# Patient Record
Sex: Male | Born: 1975 | Race: Black or African American | Hispanic: No | Marital: Single | Smoking: Former smoker
Health system: Southern US, Community
[De-identification: ages and names within clinical notes are randomized; demographics above are authoritative.]

## PROBLEM LIST (undated history)

## (undated) DIAGNOSIS — K219 Gastro-esophageal reflux disease without esophagitis: Secondary | ICD-10-CM

## (undated) DIAGNOSIS — Z87891 Personal history of nicotine dependence: Secondary | ICD-10-CM

## (undated) DIAGNOSIS — E119 Type 2 diabetes mellitus without complications: Secondary | ICD-10-CM

---

## 2005-02-11 ENCOUNTER — Emergency Department (HOSPITAL_COMMUNITY): Admission: EM | Admit: 2005-02-11 | Discharge: 2005-02-11 | Payer: Self-pay | Admitting: Emergency Medicine

## 2005-07-05 ENCOUNTER — Emergency Department: Payer: Self-pay | Admitting: Emergency Medicine

## 2010-09-10 ENCOUNTER — Emergency Department: Payer: Self-pay | Admitting: Emergency Medicine

## 2010-10-16 ENCOUNTER — Emergency Department: Payer: Self-pay | Admitting: Internal Medicine

## 2010-11-21 ENCOUNTER — Emergency Department: Payer: Self-pay | Admitting: Emergency Medicine

## 2011-10-02 ENCOUNTER — Emergency Department: Payer: Self-pay | Admitting: *Deleted

## 2011-10-02 LAB — BASIC METABOLIC PANEL
BUN: 6 mg/dL — ABNORMAL LOW (ref 7–18)
Co2: 31 mmol/L (ref 21–32)
EGFR (African American): >60
Glucose: 95 mg/dL (ref 65–99)
Osmolality: 279 (ref 275–301)
Potassium: 3.9 mmol/L (ref 3.5–5.1)

## 2011-10-02 LAB — TROPONIN I
Troponin-I: <0.02 ng/mL
Troponin-I: <0.02 ng/mL

## 2011-10-02 LAB — CBC
MCH: 26.3 pg (ref 26.0–34.0)
MCHC: 32.1 g/dL (ref 32.0–36.0)
MCV: 82 fL (ref 80–100)

## 2011-10-02 LAB — HEPATIC FUNCTION PANEL A (ARMC)
Bilirubin, Direct: <0.1 mg/dL (ref 0.00–0.20)
SGPT (ALT): 40 U/L
Total Protein: 8 g/dL (ref 6.4–8.2)

## 2011-10-02 LAB — LIPASE, BLOOD: Lipase: 109 U/L (ref 73–393)

## 2012-05-30 IMAGING — CR DG CHEST 2V
1 series · 2 of 2 positions shown · non-contrast
Comparison: none

REASON FOR EXAM: chest pain.
COMMENTS:

PROCEDURE:     DXR - DXR CHEST PA (OR AP) AND LATERAL  - October 02, 2011  [DATE]
RESULT:     The cardiac silhouette is at the upper limits of normal to
mildly enlarged. The lungs are clear. The bony and mediastinal structures
are unremarkable.

[Series 1: pa · 0.17mm/px · 2 of 2 slices shown]
[im 1/2]
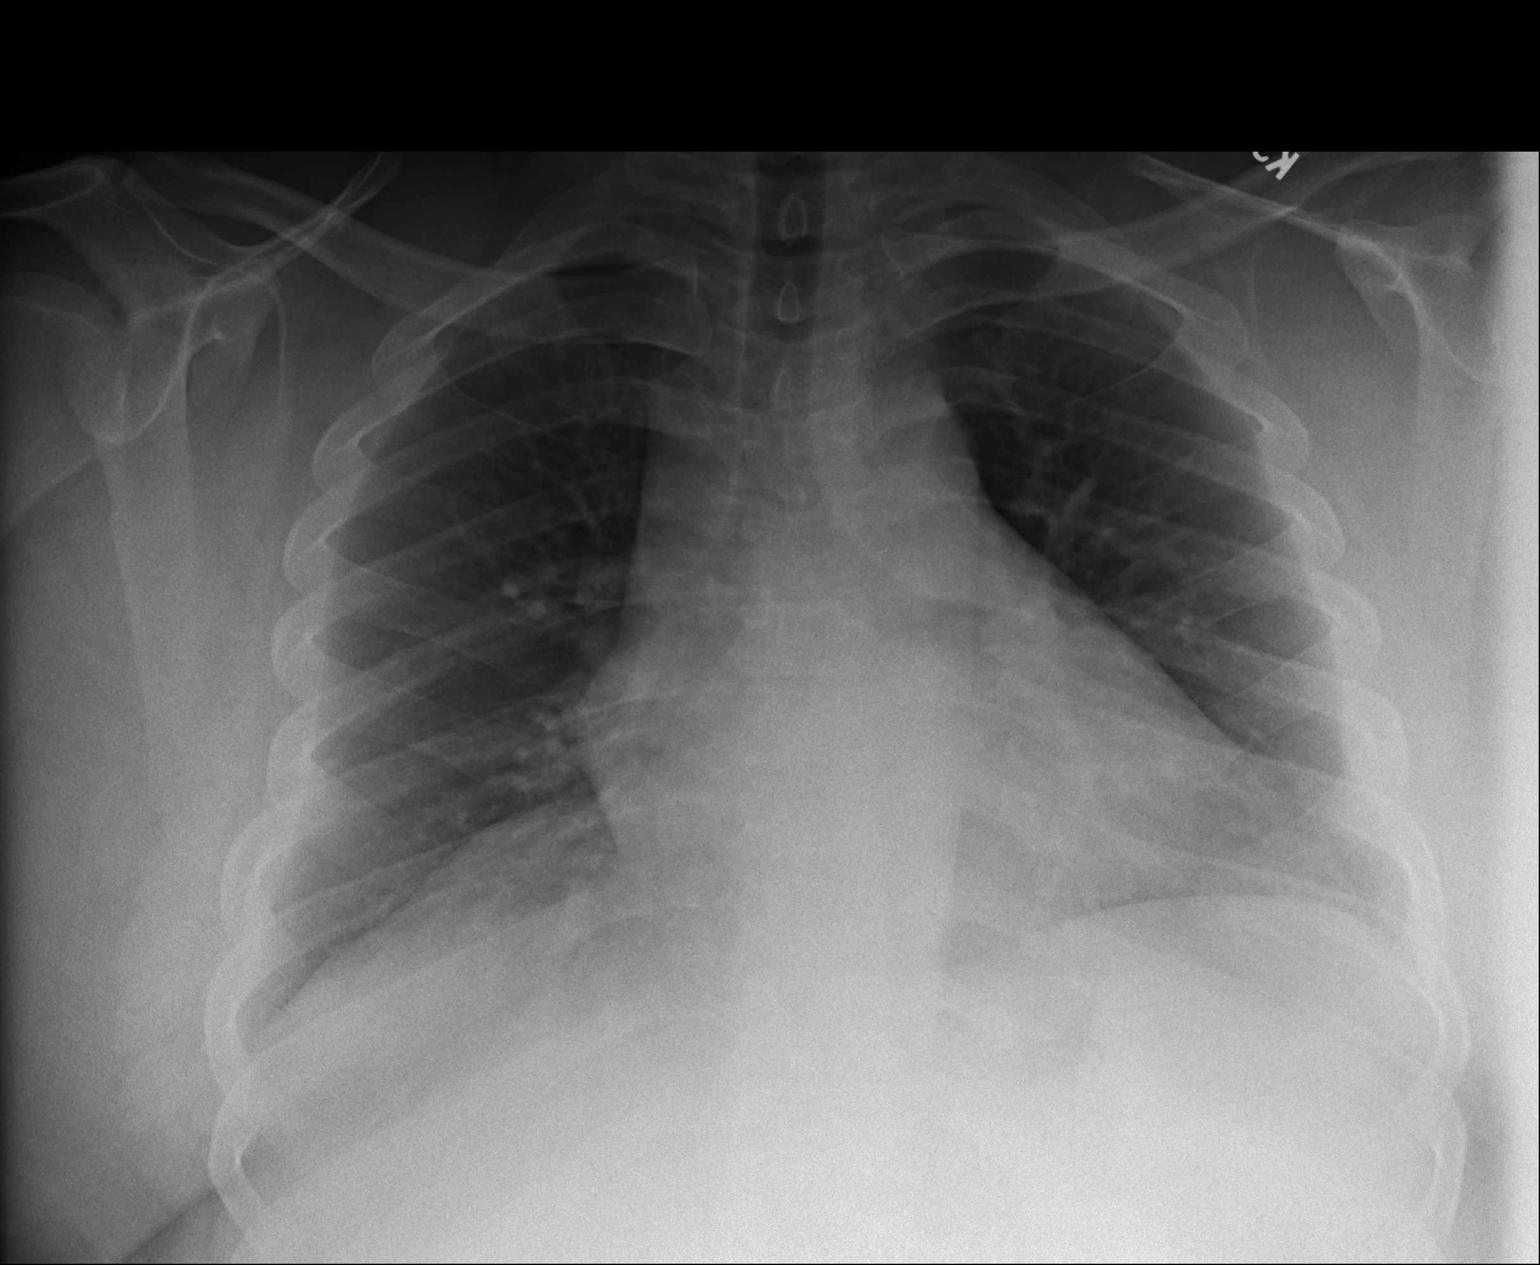
[im 2/2]
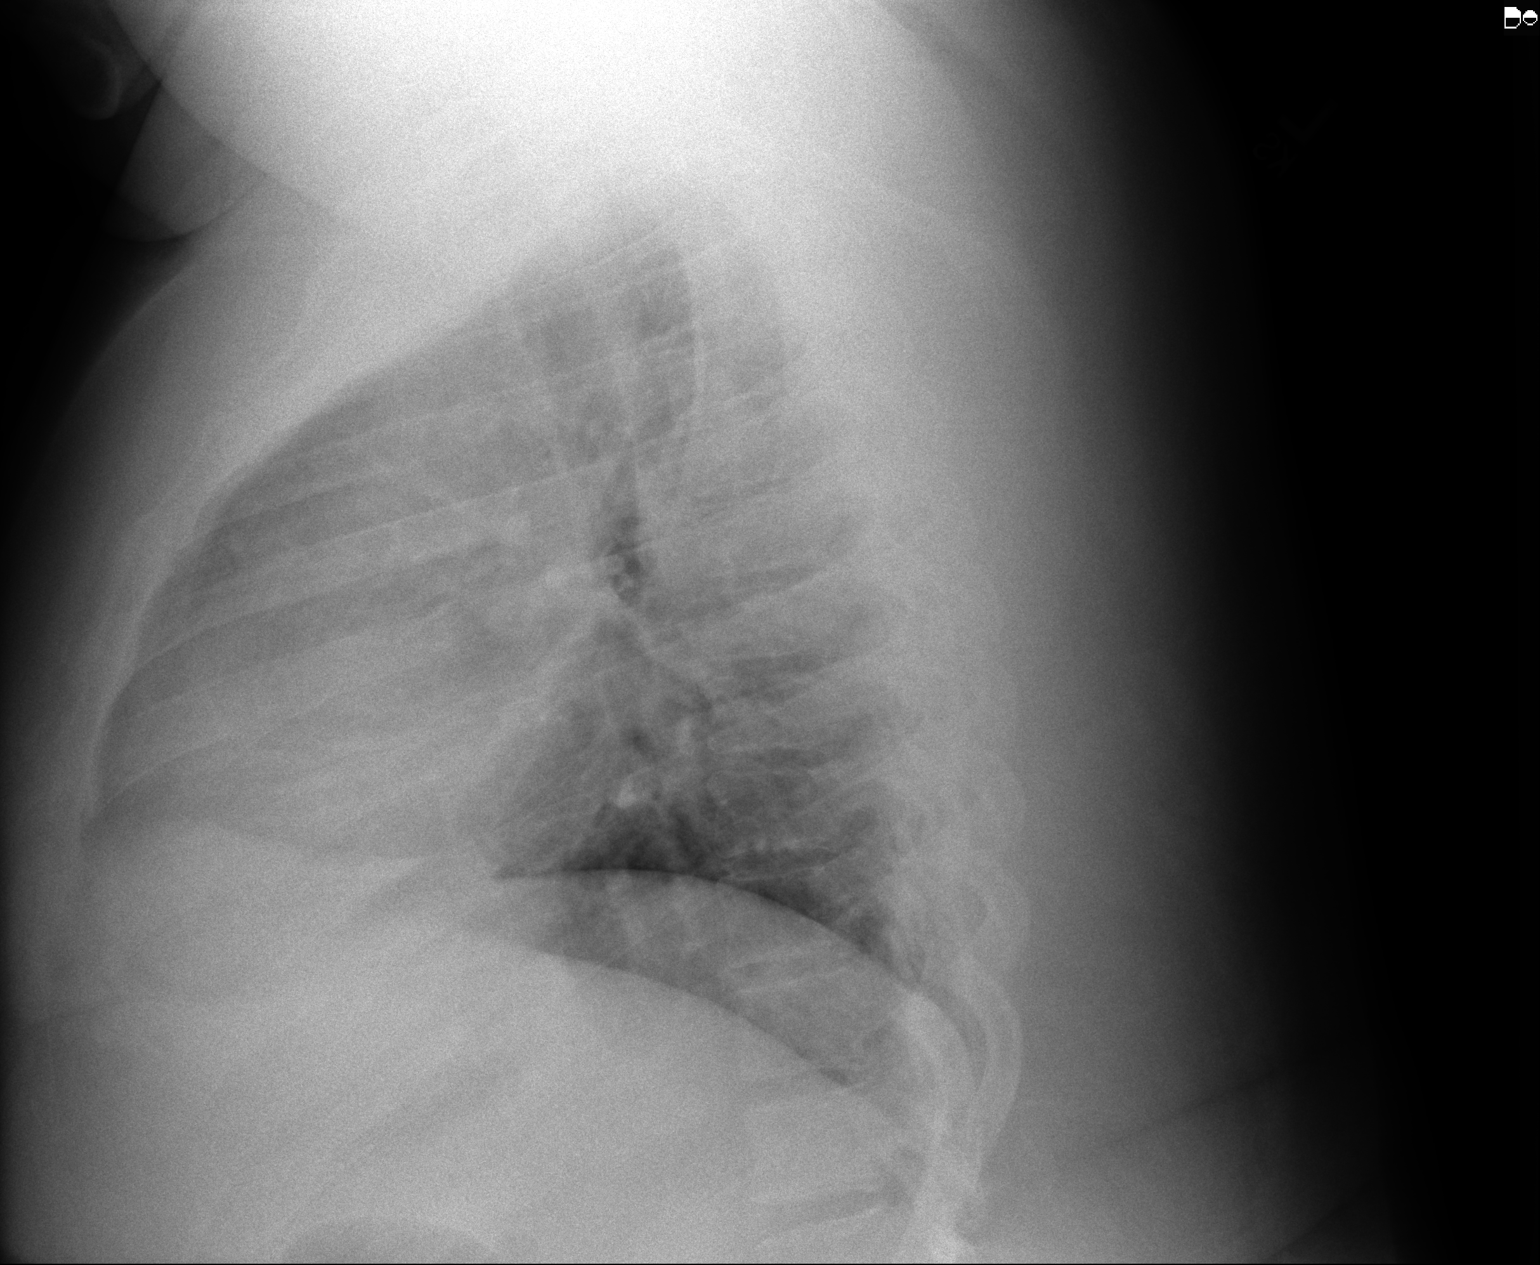

[2 of 2 positions shown; findings below may reference images not displayed]

IMPRESSION: Mild cardiomegaly.

[REDACTED]

## 2015-10-16 ENCOUNTER — Emergency Department
Admission: EM | Admit: 2015-10-16 | Discharge: 2015-10-16 | Disposition: A | Discharge status: Home / Self Care | Payer: Self-pay | Attending: Emergency Medicine | Admitting: Emergency Medicine

## 2015-10-16 ENCOUNTER — Encounter: Payer: Self-pay | Admitting: Emergency Medicine

## 2015-10-16 DIAGNOSIS — K029 Dental caries, unspecified: Secondary | ICD-10-CM | POA: Insufficient documentation

## 2015-10-16 DIAGNOSIS — K047 Periapical abscess without sinus: Secondary | ICD-10-CM | POA: Insufficient documentation

## 2015-10-16 DIAGNOSIS — F17200 Nicotine dependence, unspecified, uncomplicated: Secondary | ICD-10-CM | POA: Insufficient documentation

## 2015-10-16 DIAGNOSIS — K0500 Acute gingivitis, plaque induced: Secondary | ICD-10-CM | POA: Insufficient documentation

## 2015-10-16 MED ORDER — TRAMADOL HCL 50 MG PO TABS: 50.0000 mg | ORAL_TABLET | Freq: Three times a day (TID) | ORAL | Status: DC | PRN

## 2015-10-16 MED ORDER — PENICILLIN V POTASSIUM 500 MG PO TABS
500.0000 mg | ORAL_TABLET | Freq: Once | ORAL | Status: AC
  Administered 2015-10-16: 500 mg via ORAL
  Filled 2015-10-16: qty 1

## 2015-10-16 MED ORDER — TRAMADOL HCL 50 MG PO TABS
100.0000 mg | ORAL_TABLET | Freq: Once | ORAL | Status: AC
  Administered 2015-10-16: 100 mg via ORAL
  Filled 2015-10-16: qty 2

## 2015-10-16 MED ORDER — PENICILLIN V POTASSIUM 500 MG PO TABS: 500.0000 mg | ORAL_TABLET | Freq: Four times a day (QID) | ORAL | Status: DC

## 2015-10-16 NOTE — Discharge Instructions (Signed)
Dental Abscess °A dental abscess is a collection of pus in or around a tooth. °CAUSES °This condition is caused by a bacterial infection around the root of the tooth that involves the inner part of the tooth (pulp). It may result from: °· Severe tooth decay. °· Trauma to the tooth that allows bacteria to enter into the pulp, such as a broken or chipped tooth. °· Severe gum disease around a tooth. °SYMPTOMS °Symptoms of this condition include: °· Severe pain in and around the infected tooth. °· Swelling and redness around the infected tooth, in the mouth, or in the face. °· Tenderness. °· Pus drainage. °· Bad breath. °· Bitter taste in the mouth. °· Difficulty swallowing. °· Difficulty opening the mouth. °· Nausea. °· Vomiting. °· Chills. °· Swollen neck glands. °· Fever. °DIAGNOSIS °This condition is diagnosed with examination of the infected tooth. During the exam, your dentist may tap on the infected tooth. Your dentist will also ask about your medical and dental history and may order X-rays. °TREATMENT °This condition is treated by eliminating the infection. This may be done with: °· Antibiotic medicine. °· A root canal. This may be performed to save the tooth. °· Pulling (extracting) the tooth. This may also involve draining the abscess. This is done if the tooth cannot be saved. °HOME CARE INSTRUCTIONS °· Take medicines only as directed by your dentist. °· If you were prescribed antibiotic medicine, finish all of it even if you start to feel better. °· Rinse your mouth (gargle) often with salt water to relieve pain or swelling. °· Do not drive or operate heavy machinery while taking pain medicine. °· Do not apply heat to the outside of your mouth. °· Keep all follow-up visits as directed by your dentist. This is important. °SEEK MEDICAL CARE IF: °· Your pain is worse and is not helped by medicine. °SEEK IMMEDIATE MEDICAL CARE IF: °· You have a fever or chills. °· Your symptoms suddenly get worse. °· You have a  very bad headache. °· You have problems breathing or swallowing. °· You have trouble opening your mouth. °· You have swelling in your neck or around your eye. °  °This information is not intended to replace advice given to you by your health care provider. Make sure you discuss any questions you have with your health care provider. °  °Document Released: 05/09/2005 Document Revised: 09/23/2014 Document Reviewed: 05/06/2014 °Elsevier Interactive Patient Education ©2016 Elsevier Inc. ° °Dental Caries °Dental caries (also called tooth decay) is the most common oral disease. It can occur at any age but is more common in children and young adults.  °HOW DENTAL CARIES DEVELOPS  °The process of decay begins when bacteria and foods (particularly sugars and starches) combine in your mouth to produce plaque. Plaque is a substance that sticks to the hard, outer surface of a tooth (enamel). The bacteria in plaque produce acids that attack enamel. These acids may also attack the root surface of a tooth (cementum) if it is exposed. Repeated attacks dissolve these surfaces and create holes in the tooth (cavities). If left untreated, the acids destroy the other layers of the tooth.  °RISK FACTORS °· Frequent sipping of sugary beverages.   °· Frequent snacking on sugary and starchy foods, especially those that easily get stuck in the teeth.   °· Poor oral hygiene.   °· Dry mouth.   °· Substance abuse such as methamphetamine abuse.   °· Broken or poor-fitting dental restorations.   °· Eating disorders.   °· Gastroesophageal reflux disease (  GERD).   Certain radiation treatments to the head and neck. SYMPTOMS In the early stages of dental caries, symptoms are seldom present. Sometimes white, chalky areas may be seen on the enamel or other tooth layers. In later stages, symptoms may include:  Pits and holes on the enamel.  Toothache after sweet, hot, or cold foods or drinks are consumed.  Pain around the tooth.  Swelling  around the tooth. DIAGNOSIS  Most of the time, dental caries is detected during a regular dental checkup. A diagnosis is made after a thorough medical and dental history is taken and the surfaces of your teeth are checked for signs of dental caries. Sometimes special instruments, such as lasers, are used to check for dental caries. Dental X-ray exams may be taken so that areas not visible to the eye (such as between the contact areas of the teeth) can be checked for cavities.  TREATMENT  If dental caries is in its early stages, it may be reversed with a fluoride treatment or an application of a remineralizing agent at the dental office. Thorough brushing and flossing at home is needed to aid these treatments. If it is in its later stages, treatment depends on the location and extent of tooth destruction:   If a small area of the tooth has been destroyed, the destroyed area will be removed and cavities will be filled with a material such as gold, silver amalgam, or composite resin.   If a large area of the tooth has been destroyed, the destroyed area will be removed and a cap (crown) will be fitted over the remaining tooth structure.   If the center part of the tooth (pulp) is affected, a procedure called a root canal will be needed before a filling or crown can be placed.   If most of the tooth has been destroyed, the tooth may need to be pulled (extracted). HOME CARE INSTRUCTIONS You can prevent, stop, or reverse dental caries at home by practicing good oral hygiene. Good oral hygiene includes:  Thoroughly cleaning your teeth at least twice a day with a toothbrush and dental floss.   Using a fluoride toothpaste. A fluoride mouth rinse may also be used if recommended by your dentist or health care provider.   Restricting the amount of sugary and starchy foods and sugary liquids you consume.   Avoiding frequent snacking on these foods and sipping of these liquids.   Keeping regular  visits with a dentist for checkups and cleanings. PREVENTION   Practice good oral hygiene.  Consider a dental sealant. A dental sealant is a coating material that is applied by your dentist to the pits and grooves of teeth. The sealant prevents food from being trapped in them. It may protect the teeth for several years.  Ask about fluoride supplements if you live in a community without fluorinated water or with water that has a low fluoride content. Use fluoride supplements as directed by your dentist or health care provider.  Allow fluoride varnish applications to teeth if directed by your dentist or health care provider.   This information is not intended to replace advice given to you by your health care provider. Make sure you discuss any questions you have with your health care provider.   Document Released: 01/29/2002 Document Revised: 05/30/2014 Document Reviewed: 05/11/2012 Elsevier Interactive Patient Education 2016 [place] and Dentist Visits Dental care supports good overall health. Regular dental visits can also help you avoid dental pain, bleeding, infection,  and other more serious health problems in the future. It is important to keep the mouth healthy because diseases in the teeth, gums, and other oral tissues can spread to other areas of the body. Some problems, such as diabetes, heart disease, and pre-term labor have been associated with poor oral health.  See your dentist every 6 months. If you experience emergency problems such as a toothache or broken tooth, go to the dentist right away. If you see your dentist regularly, you may catch problems early. It is easier to be treated for problems in the early stages.  WHAT TO EXPECT AT A DENTIST VISIT  Your dentist will look for many common oral health problems and recommend proper treatment. At your regular dental visit, you can expect:  Gentle cleaning of the teeth and gums. This includes scraping and  polishing. This helps to remove the sticky substance around the teeth and gums (plaque). Plaque forms in the mouth shortly after eating. Over time, plaque hardens on the teeth as tartar. If tartar is not removed regularly, it can cause problems. Cleaning also helps remove stains.  Periodic X-rays. These pictures of the teeth and supporting bone will help your dentist assess the health of your teeth.  Periodic fluoride treatments. Fluoride is a natural mineral shown to help strengthen teeth. Fluoride treatmentinvolves applying a fluoride gel or varnish to the teeth. It is most commonly done in children.  Examination of the mouth, tongue, jaws, teeth, and gums to look for any oral health problems, such as:  Cavities (dental caries). This is decay on the tooth caused by plaque, sugar, and acid in the mouth. It is best to catch a cavity when it is small.  Inflammation of the gums caused by plaque buildup (gingivitis).  Problems with the mouth or malformed or misaligned teeth.  Oral cancer or other diseases of the soft tissues or jaws. KEEP YOUR TEETH AND GUMS HEALTHY For healthy teeth and gums, follow these general guidelines as well as your dentist's specific advice:  Have your teeth professionally cleaned at the dentist every 6 months.  Brush twice daily with a fluoride toothpaste.  Floss your teeth daily.  Ask your dentist if you need fluoride supplements, treatments, or fluoride toothpaste.  Eat a healthy diet. Reduce foods and drinks with added sugar.  Avoid smoking. TREATMENT FOR ORAL HEALTH PROBLEMS If you have oral health problems, treatment varies depending on the conditions present in your teeth and gums.  Your caregiver will most likely recommend good oral hygiene at each visit.  For cavities, gingivitis, or other oral health disease, your caregiver will perform a procedure to treat the problem. This is typically done at a separate appointment. Sometimes your caregiver  will refer you to another dental specialist for specific tooth problems or for surgery. SEEK IMMEDIATE DENTAL CARE IF:  You have pain, bleeding, or soreness in the gum, tooth, jaw, or mouth area.  A permanent tooth becomes loose or separated from the gum socket.  You experience a blow or injury to the mouth or jaw area.   This information is not intended to replace advice given to you by your health care provider. Make sure you discuss any questions you have with your health care provider.   Document Released: 01/19/2011 Document Revised: 08/01/2011 Document Reviewed: 01/19/2011 Elsevier Interactive Patient Education [employer]2016 Elsevier Inc.   Take the antibiotic as directed. Apply warm compresses to the face to reduce swelling. Rinse with warm, salty water several times a day. Brush  with a soft toothbrush twice daily.   OPTIONS FOR DENTAL FOLLOW UP CARE  [place] Department of Health and Human Services - Local Safety Net Dental Clinics TripDoors.com.htm   [facility] [phone])  [facility] [phone])  [place] [phone])  [facility] [phone])  [facility] [phone]) This clinic caters to the indigent population and is on a lottery system. Location: [employer] [facility], [employer], [address] [place] Clinic Hours: Wednesdays from 6pm - 9pm, patients seen by a lottery system. For dates, call or go to [email or web address] Services: Cleanings, fillings and simple extractions. Payment Options: DENTAL WORK IS FREE OF CHARGE. Bring proof of income or support. Best way to get seen: Arrive at 5:15 pm - this is a lottery, NOT first come/first serve, so arriving earlier will not increase your chances of being seen.     [facility] [phone] Select option 1 for emergencies    Location: [facility], [place], [address], [place] Clinic Hours: No walk-ins accepted - call the day before to schedule an appointment. Check in times are 9:30 am and 1:30 pm. Services: Simple extractions, temporary fillings, pulpectomy/pulp debridement, uncomplicated abscess drainage. Payment Options: PAYMENT IS DUE AT THE TIME OF SERVICE.  Fee is usually $100-200, additional surgical procedures (e.g. abscess drainage) may be extra. Cash, checks, Visa/MasterCard accepted.  Can file Medicaid if patient is covered for dental - patient should call case worker to check. No discount for [facility] patients. Best way to get seen: MUST call the day before and get onto the schedule. Can usually be seen the next 1-2 days. No walk-ins accepted.     [facility] [phone]   Location: [facility], [address], [place] Clinic Hours: M, W, Th, F 8am or 1:30pm, Tues 9a or 1:30 - first come/first served. Services: Simple extractions, temporary fillings, uncomplicated abscess drainage.  You do not need to be an [facility] resident. Payment Options: PAYMENT IS DUE AT THE TIME OF SERVICE. Dental insurance, otherwise sliding scale - bring proof of income or support. Depending on income and treatment needed, cost is usually $50-200. Best way to get seen: Arrive early as it is first come/first served.     [facility] [phone]   Location: [address] Clinic Hours: Mon-Thu 8a-5p Services: Most basic dental services including extractions and fillings. Payment Options: PAYMENT IS DUE AT THE TIME OF SERVICE. Sliding scale, up to 50% off - bring proof if income or support. Medicaid with dental option accepted. Best way to get seen: Call to schedule an appointment, can usually be seen within 2 weeks OR they will try to see walk-ins - show up at 8a or 2p (you may have  to wait).     [facility] [phone] [place] RESIDENTS ONLY   Location: [facility], [address], [place], [place] [address] Clinic Hours: By appointment only. Monday - Thursday 8am-5pm, Friday 8am-12pm Services: Cleanings, fillings, extractions. Payment Options: PAYMENT IS DUE AT THE TIME OF SERVICE. Cash, Visa or MasterCard. Sliding scale - $30 minimum per service. Best way to get seen: Come in to office, complete packet and make an appointment - need proof of income or support monies for each household member and proof of [facility] residence. Usually takes about a month to get in.     [facility]  Dental Clinic [phone]   Location: [address], [facility] Hours: Walk-in Urgent Care Dental Services are offered Monday-Friday mornings only. The numbers of emergencies accepted daily is limited to the number of providers available. Maximum 15 - Mondays, Wednesdays & Thursdays Maximum 10 - Tuesdays & Fridays Services: You do not need to be a [facility] resident to be seen for a dental emergency. Emergencies are defined as pain, swelling, abnormal bleeding, or dental trauma. Walkins will receive x-rays if needed. NOTE: Dental cleaning is not an emergency. Payment Options: PAYMENT IS DUE AT THE TIME OF SERVICE. Minimum co-pay is $40.00 for uninsured patients. Minimum co-pay is $3.00 for Medicaid with dental coverage. Dental Insurance is accepted and must be presented at time of visit. Medicare does not cover dental. Forms of payment: Cash, credit card, checks. Best way to get seen: If not previously registered with the clinic, walk-in dental registration begins at 7:15 am and is on a first come/first serve basis. If previously registered with the clinic, call to make an appointment.     [facility] [phone] [place] RESIDENTS ONLY   Location: [address] [place],  [place] Clinic Hours: Mon-Thu 10a-2p Services: Extractions only! Payment Options: FREE (donations accepted) - bring proof of income or support Best way to get seen: Call and schedule an appointment OR come at 8am on the 1st Monday of every month (except for holidays) when it is first come/first served.     [facility] [phone]   Location: [address] [place], [place] Clinic Hours: Friday mornings Services, Payment Options, Best way to get seen: Call for info

## 2015-10-16 NOTE — ED Notes (Signed)
Tooth pain for a few days   Noticed facial swelling 2 days ago

## 2015-10-16 NOTE — ED Notes (Signed)
States he has had some pain and swelling to left side of face about 1-2 weeks ago  That went down  Now has swelling to right side of face states he does have a loose tooth in same area..main pain is at jaw and into neck

## 2015-10-21 NOTE — ED Provider Notes (Signed)
[facility] Emergency Department Provider Note ____________________________________________  Time seen: 0920  I have reviewed the triage Valenzuela signs and the nursing notes.  HISTORY  Chief Complaint  Facial Swelling  HPI [name] is a 40 y.o. male presents to the ED for lower jaw pain and swelling suddenly over the last 2 days.He admits that the swelling to the face has been intermittent over the last 1-2 weeks. He also admits to poor dentition in general. He describes that the swelling initially had gone down and now she return to the lower jaw. He does also noted loose tooth in the same area. He denies any interim fevers, chills, sweats. He also has not had any nausea, vomiting, or diarrhea. He rates his overall discomfort at a 10/10 in triage.  History reviewed. No pertinent past medical history.  There are no active problems to display for this patient.   History reviewed. No pertinent past surgical history.  Current Outpatient Rx  Name  Route  Sig  Dispense  Refill  . penicillin v potassium (VEETID) 500 MG tablet   Oral   Take 1 tablet (500 mg total) by mouth 4 (four) times daily.   40 tablet   0   . traMADol (ULTRAM) 50 MG tablet   Oral   Take 1 tablet (50 mg total) by mouth 3 (three) times daily as needed.   15 tablet   0    Allergies Review of patient's allergies indicates not on file.  No family history on file.  Social History Social History  Substance Use Topics  . Smoking status: Current Every Day Smoker  . Smokeless tobacco: None  . Alcohol Use: No   Review of Systems  Constitutional: Negative for fever. Eyes: Negative for visual changes. ENT: Negative for sore throat. Dental pain with left lower jaw swelling above.  Cardiovascular: Negative for chest pain. Respiratory: Negative for shortness of breath. Gastrointestinal: Negative for abdominal pain, vomiting and diarrhea. Musculoskeletal: Negative for back  pain. Neurological: Negative for headaches, focal weakness or numbness. ____________________________________________  PHYSICAL EXAM:  Valenzuela SIGNS: ED Triage Vitals  Enc Vitals Group     BP 10/16/15 0904 139/79 mmHg     Pulse Rate 10/16/15 0904 86     Resp 10/16/15 0904 20     Temp 10/16/15 0904 97.6 F (36.4 C)     Temp Source 10/16/15 0904 Oral     SpO2 10/16/15 0904 96 %     Weight 10/16/15 0904 320 lb (145.151 kg)     Height 10/16/15 0904  (1.676 m)     Head Cir --      Peak Flow --      Pain Score 10/16/15 0905 10     Pain Loc --      Pain Edu? --      Excl. in GC? --    Constitutional: Alert and oriented. Well appearing and in no distress. Head: Normocephalic and atraumatic. Moderate facial swelling to the lower left jaw      Eyes: Conjunctivae are normal. PERRL. Normal extraocular movements      Ears: Canals clear. TMs intact bilaterally.   Nose: No congestion/rhinorrhea.   Mouth/Throat: Mucous membranes are moist. Patient with poor dentition noted globally. He has moderate plaque buildup and gingivitis. He is noted to have acute local swelling to the lower aspect of the left jaw. There is also noted to be some purulent drainage from the gumline with manipulation over the premolar. No  pointing fluctuance is appreciated.   Neck: Supple. No thyromegaly. Hematological/Lymphatic/Immunological: No cervical lymphadenopathy. Cardiovascular: Normal rate, regular rhythm.  Respiratory: Normal respiratory effort. No wheezes/rales/rhonchi. Neurologic:  No gross focal neurologic deficits are appreciated. Skin:  Skin is warm, dry and intact. No rash noted. ____________________________________________  PROCEDURES  Ultram 100 mg PO Pen VK 500 mg pO ____________________________________________  INITIAL IMPRESSION / ASSESSMENT AND PLAN / ED COURSE  Patient with an acute dental abscess secondary to dental caries and underlying gingivitis. He is advised to dose the  prescription antibiotic as directed until completed. He is also provided with a prescription for Ultram to dose as needed for acute pain relief. He will follow up with one of the local dental providers on the list given. Return to the ED for acutely worsening symptoms including facial swelling, pain or airway compromise. ____________________________________________  FINAL CLINICAL IMPRESSION(S) / ED DIAGNOSES  Final diagnoses:  Dental abscess  Dental caries  Gingivitis, acute, plaque induced     Lissa HoardJenise V Bacon Margeart Allender, PA-C 10/21/15 0116  Phineas SemenGraydon Goodman, MD 10/26/15 [phone]

## 2016-11-13 ENCOUNTER — Emergency Department: Payer: No Typology Code available for payment source

## 2016-11-13 ENCOUNTER — Encounter: Payer: Self-pay | Admitting: Emergency Medicine

## 2016-11-13 ENCOUNTER — Emergency Department
Admission: EM | Admit: 2016-11-13 | Discharge: 2016-11-13 | Disposition: A | Discharge status: Home / Self Care | Payer: No Typology Code available for payment source | Attending: Student in an Organized Health Care Education/Training Program | Admitting: Student in an Organized Health Care Education/Training Program

## 2016-11-13 DIAGNOSIS — S161XXA Strain of muscle, fascia and tendon at neck level, initial encounter: Secondary | ICD-10-CM | POA: Diagnosis not present

## 2016-11-13 DIAGNOSIS — M791 Myalgia: Secondary | ICD-10-CM | POA: Diagnosis not present

## 2016-11-13 DIAGNOSIS — Z79899 Other long term (current) drug therapy: Secondary | ICD-10-CM | POA: Insufficient documentation

## 2016-11-13 DIAGNOSIS — Y9389 Activity, other specified: Secondary | ICD-10-CM | POA: Diagnosis not present

## 2016-11-13 DIAGNOSIS — Y998 Other external cause status: Secondary | ICD-10-CM | POA: Diagnosis not present

## 2016-11-13 DIAGNOSIS — F17210 Nicotine dependence, cigarettes, uncomplicated: Secondary | ICD-10-CM | POA: Insufficient documentation

## 2016-11-13 DIAGNOSIS — Y92410 Unspecified street and highway as the place of occurrence of the external cause: Secondary | ICD-10-CM | POA: Insufficient documentation

## 2016-11-13 DIAGNOSIS — M7918 Myalgia, other site: Secondary | ICD-10-CM

## 2016-11-13 DIAGNOSIS — M542 Cervicalgia: Secondary | ICD-10-CM

## 2016-11-13 DIAGNOSIS — R0789 Other chest pain: Secondary | ICD-10-CM | POA: Diagnosis present

## 2016-11-13 LAB — CBC
HEMATOCRIT: 41.4 % (ref 40.0–52.0)
Hemoglobin: 13.6 g/dL (ref 13.0–18.0)
MCH: 26.2 pg (ref 26.0–34.0)
MCHC: 33 g/dL (ref 32.0–36.0)
MCV: 79.4 fL — AB (ref 80.0–100.0)
PLATELETS: 238 10*3/uL (ref 150–440)
RBC: 5.21 MIL/uL (ref 4.40–5.90)
RDW: 15.3 % — ABNORMAL HIGH (ref 11.5–14.5)
WBC: 10.4 10*3/uL (ref 3.8–10.6)

## 2016-11-13 LAB — BASIC METABOLIC PANEL
Anion gap: 6 (ref 5–15)
BUN: 7 mg/dL (ref 6–20)
CHLORIDE: 106 mmol/L (ref 101–111)
CO2: 28 mmol/L (ref 22–32)
Calcium: 9.2 mg/dL (ref 8.9–10.3)
Creatinine, Ser: 0.99 mg/dL (ref 0.61–1.24)
Glucose, Bld: 143 mg/dL — ABNORMAL HIGH (ref 65–99)
POTASSIUM: 3.6 mmol/L (ref 3.5–5.1)
SODIUM: 140 mmol/L (ref 135–145)

## 2016-11-13 LAB — TROPONIN I: Troponin I: <0.03 ng/mL (ref <0.03)

## 2016-11-13 MED ORDER — OXYCODONE-ACETAMINOPHEN 5-325 MG PO TABS
2.0000 | ORAL_TABLET | Freq: Once | ORAL | Status: AC
  Administered 2016-11-13: 2 via ORAL
  Filled 2016-11-13: qty 2

## 2016-11-13 MED ORDER — HYDROCODONE-ACETAMINOPHEN 5-325 MG PO TABS: 1.0000 | ORAL_TABLET | ORAL | 0 refills | Status: DC | PRN

## 2016-11-13 MED ORDER — NAPROXEN 375 MG PO TABS: 375.0000 mg | ORAL_TABLET | Freq: Two times a day (BID) | ORAL | 0 refills | Status: AC

## 2016-11-13 MED ORDER — CYCLOBENZAPRINE HCL 10 MG PO TABS: 10.0000 mg | ORAL_TABLET | Freq: Three times a day (TID) | ORAL | 0 refills | Status: DC | PRN
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
## 2017-09-08 ENCOUNTER — Other Ambulatory Visit: Payer: Self-pay

## 2017-09-08 ENCOUNTER — Emergency Department: Payer: Self-pay

## 2017-09-08 ENCOUNTER — Emergency Department
Admission: EM | Admit: 2017-09-08 | Discharge: 2017-09-08 | Disposition: A | Discharge status: Home / Self Care | Payer: Self-pay | Attending: Emergency Medicine | Admitting: Emergency Medicine

## 2017-09-08 DIAGNOSIS — R0789 Other chest pain: Secondary | ICD-10-CM

## 2017-09-08 DIAGNOSIS — F17210 Nicotine dependence, cigarettes, uncomplicated: Secondary | ICD-10-CM | POA: Insufficient documentation

## 2017-09-08 LAB — CBC WITH DIFFERENTIAL/PLATELET
Basophils Absolute: 0.1 10*3/uL (ref 0–0.1)
Basophils Relative: 1 %
EOS PCT: 3 %
Eosinophils Absolute: 0.3 10*3/uL (ref 0–0.7)
HCT: 39.9 % — ABNORMAL LOW (ref 40.0–52.0)
Hemoglobin: 13.4 g/dL (ref 13.0–18.0)
LYMPHS ABS: 2.7 10*3/uL (ref 1.0–3.6)
LYMPHS PCT: 27 %
MCH: 27 pg (ref 26.0–34.0)
MCHC: 33.7 g/dL (ref 32.0–36.0)
MCV: 80.1 fL (ref 80.0–100.0)
MONOS PCT: 6 %
Monocytes Absolute: 0.5 10*3/uL (ref 0.2–1.0)
Neutro Abs: 6.3 10*3/uL (ref 1.4–6.5)
Neutrophils Relative %: 63 %
PLATELETS: 229 10*3/uL (ref 150–440)
RBC: 4.98 MIL/uL (ref 4.40–5.90)
RDW: 15.4 % — ABNORMAL HIGH (ref 11.5–14.5)
WBC: 9.9 10*3/uL (ref 3.8–10.6)

## 2017-09-08 LAB — COMPREHENSIVE METABOLIC PANEL
ALT: 25 U/L (ref 17–63)
AST: 16 U/L (ref 15–41)
Albumin: 4 g/dL (ref 3.5–5.0)
Alkaline Phosphatase: 38 U/L (ref 38–126)
Anion gap: 5 (ref 5–15)
BUN: 6 mg/dL (ref 6–20)
CO2: 29 mmol/L (ref 22–32)
Calcium: 8.9 mg/dL (ref 8.9–10.3)
Chloride: 105 mmol/L (ref 101–111)
Creatinine, Ser: 0.68 mg/dL (ref 0.61–1.24)
GFR calc Af Amer: >60 mL/min (ref >60)
GFR calc non Af Amer: >60 mL/min (ref >60)
Glucose, Bld: 115 mg/dL — ABNORMAL HIGH (ref 65–99)
Potassium: 3.7 mmol/L (ref 3.5–5.1)
Sodium: 139 mmol/L (ref 135–145)
Total Bilirubin: 0.4 mg/dL (ref 0.3–1.2)
Total Protein: 7.6 g/dL (ref 6.5–8.1)

## 2017-09-08 LAB — TROPONIN I
Troponin I: <0.03 ng/mL (ref <0.03)
Troponin I: <0.03 ng/mL (ref <0.03)

## 2017-09-08 MED ORDER — DIPHENHYDRAMINE HCL 25 MG PO CAPS: 50.0000 mg | ORAL_CAPSULE | Freq: Four times a day (QID) | ORAL | 0 refills | Status: DC | PRN

## 2017-09-08 MED ORDER — METOCLOPRAMIDE HCL 10 MG PO TABS: 10.0000 mg | ORAL_TABLET | Freq: Four times a day (QID) | ORAL | 0 refills | Status: DC | PRN

## 2017-09-08 MED ORDER — MORPHINE SULFATE (PF) 2 MG/ML IV SOLN
INTRAVENOUS | Status: AC
  Filled 2017-09-08: qty 1

## 2017-09-08 MED ORDER — GI COCKTAIL ~~LOC~~
30.0000 mL | ORAL | Status: AC
  Administered 2017-09-08: 30 mL via ORAL
  Filled 2017-09-08: qty 30

## 2017-09-08 MED ORDER — FAMOTIDINE 20 MG PO TABS
40.0000 mg | ORAL_TABLET | Freq: Once | ORAL | Status: AC
  Administered 2017-09-08: 40 mg via ORAL
  Filled 2017-09-08: qty 2

## 2017-09-08 NOTE — ED Triage Notes (Signed)
Per EMS, reports intermittent chest burning sensation. Pt also states SHOB when lying down, pt states this has been occurring last 3 days. Pt denies OTC medications. EMS placed pt on 2L and pt states improvement with SHOB. EMS VS: 130/78, 99% on 2L, 76HR, CBG 89.Pt A&O at this time, able to answer questions.

## 2017-09-08 NOTE — ED Provider Notes (Signed)
[facility] Emergency Department Provider Note  ____________________________________________  Time seen: Approximately 6:00 AM  I have reviewed the triage vital signs and the nursing notes.   HISTORY  Chief Complaint Chest Pain    HPI [name] is a 42 y.o. male who complains of chest pain, intermittent for the past 3 days. Worse lying down, better sitting upright. He feels like burning, feels like acid reflux. Worse tonight and so he called EMS. Denies shortness of breath. Not exertional, not pleuritic. No radiation, vomiting, or diaphoresis.      History reviewed. No pertinent past medical history.   There are no active problems to display for this patient.    History reviewed. No pertinent surgical history.   Prior to Admission medications   Medication Sig Start Date End Date Taking? Authorizing Provider  cyclobenzaprine (FLEXERIL) 10 MG tablet Take 1 tablet (10 mg total) by mouth 3 (three) times daily as needed for muscle spasms. 11/13/16   Willy Eddyobinson, Patrick, MD  diphenhydrAMINE (BENADRYL) 25 mg capsule Take 2 capsules (50 mg total) by mouth every 6 (six) hours as needed. 09/08/17   Sharman CheekStafford, Peirce Deveney, MD  HYDROcodone-acetaminophen (NORCO) 5-325 MG tablet Take 1 tablet by mouth every 4 (four) hours as needed for moderate pain. 11/13/16   Willy Eddyobinson, Patrick, MD  metoCLOPramide (REGLAN) 10 MG tablet Take 1 tablet (10 mg total) by mouth every 6 (six) hours as needed. 09/08/17   Sharman CheekStafford, Zilla Shartzer, MD  penicillin v potassium (VEETID) 500 MG tablet Take 1 tablet (500 mg total) by mouth 4 (four) times daily. 10/16/15   Menshew, Charlesetta IvoryJenise V Bacon, PA-C  traMADol (ULTRAM) 50 MG tablet Take 1 tablet (50 mg total) by mouth 3 (three) times daily as needed. 10/16/15   Menshew, Charlesetta IvoryJenise V Bacon, PA-C     Allergies Patient has no known allergies.   No family history on file.  Social History Social History   Tobacco Use  . Smoking status: Current  Every Day Smoker    Packs/day: 1.00    Types: Cigarettes  . Smokeless tobacco: Never Used  Substance Use Topics  . Alcohol use: No  . Drug use: Not on file    Review of Systems  Constitutional:   No fever or chills.  ENT:   No sore throat. No rhinorrhea. Cardiovascular:   positive as above chest pain without syncope. Respiratory:   No dyspnea or cough. Gastrointestinal:   Negative for abdominal pain, vomiting and diarrhea.  Musculoskeletal:   Negative for focal pain or swelling All other systems reviewed and are negative except as documented above in ROS and HPI.  ____________________________________________   PHYSICAL EXAM:  VITAL SIGNS: ED Triage Vitals  Enc Vitals Group     BP 09/08/17 0530 (!) 141/100     Pulse Rate 09/08/17 0530 74     Resp 09/08/17 0530 20     Temp 09/08/17 0530 98.6 F (37 C)     Temp Source 09/08/17 0530 Oral     SpO2 09/08/17 0530 98 %     Weight 09/08/17 0536 (!) 320 lb (145.2 kg)     Height 09/08/17 0536 5\' 10"  (1.778 m)     Head Circumference --      Peak Flow --      Pain Score 09/08/17 0535 5     Pain Loc --      Pain Edu? --      Excl. in GC? --     Vital signs reviewed, nursing assessments  reviewed.   Constitutional:   Alert and oriented. Well appearing and in no distress.morbidly obese Eyes:   Conjunctivae are normal. EOMI. PERRL. ENT      Head:   Normocephalic and atraumatic.      Nose:   No congestion/rhinnorhea.       Mouth/Throat:   MMM, no pharyngeal erythema. No peritonsillar mass.       Neck:   No meningismus. Full ROM. Hematological/Lymphatic/Immunilogical:   No cervical lymphadenopathy. Cardiovascular:   RRR. Symmetric bilateral radial and DP pulses.  No murmurs.  Respiratory:   Normal respiratory effort without tachypnea/retractions. Breath sounds are clear and equal bilaterally. No wheezes/rales/rhonchi. Gastrointestinal:   Soft and nontender. Non distended. There is no CVA tenderness.  No rebound, rigidity, or  guarding. Genitourinary:   deferred Musculoskeletal:   Normal range of motion in all extremities. No joint effusions.  No lower extremity tenderness.  No edema.chest wall nontender Neurologic:   Normal speech and language.  Motor grossly intact. No acute focal neurologic deficits are appreciated.  Skin:    Skin is warm, dry and intact. No rash noted.  No petechiae, purpura, or bullae.  ____________________________________________    LABS (pertinent positives/negatives) (all labs ordered are listed, but only abnormal results are displayed) Labs Reviewed  COMPREHENSIVE METABOLIC PANEL - Abnormal; Notable for the following components:      Result Value   Glucose, Bld 115 (*)    All other components within normal limits  CBC WITH DIFFERENTIAL/PLATELET - Abnormal; Notable for the following components:   HCT 39.9 (*)    RDW 15.4 (*)    All other components within normal limits  TROPONIN I  TROPONIN I   ____________________________________________   EKG  interpreted by me  Date: 09/08/2017  Rate: 68  Rhythm: normal sinus rhythm  QRS Axis: normal  Intervals: normal  ST/T Wave abnormalities: normal  Conduction Disutrbances: none  Narrative Interpretation: unremarkable      ____________________________________________    RADIOLOGY  Dg Chest 2 View  Result Date: 09/08/2017 CLINICAL DATA:  Acute onset of shortness of breath and burning sensation in the chest. EXAM: CHEST - 2 VIEW COMPARISON:  Chest radiograph and CT of the chest performed 11/13/2016 FINDINGS: The lungs are well-aerated. Mild peribronchial thickening is noted. There is no evidence of focal opacification, pleural effusion or pneumothorax. The heart is normal in size; the mediastinal contour is within normal limits. No acute osseous abnormalities are seen. IMPRESSION: Mild peribronchial thickening noted; lungs otherwise clear. Electronically Signed   By: Roanna Raider M.D.   On: 09/08/2017 06:11     ____________________________________________   PROCEDURES Procedures  ____________________________________________  DIFFERENTIAL DIAGNOSIS   GERD, ACS, pulmonary edema, pneumothorax  CLINICAL IMPRESSION / ASSESSMENT AND PLAN / ED COURSE  Pertinent labs & imaging results that were available during my care of the patient were reviewed by me and considered in my medical decision making (see chart for details).    patient well appearing no acute distress, presents with atypical chest pain. Vital signs unremarkable, EKG normal. Physical exam reassuring. Doubt dissection AAA PE or sepsis. Check labs and chest x-ray. If the initial workup was all unremarkable, would plan to check a second troponin before discharge home for outpatient follow-up.  ----------------------------------------- 7:33 AM on 09/08/2017 -----------------------------------------  Initial EKG chest x-ray and labs all unremarkable.  Clinical Course as of Sep 09 731  Fri Sep 08, 2017  1610 Care signed out to Dr. Manson Passey pending 2nd trop.    [PS]  Clinical Course User Index [PS] Sharman Cheek, MD     ____________________________________________   FINAL CLINICAL IMPRESSION(S) / ED DIAGNOSES    Final diagnoses:  Atypical chest pain     ED Discharge Orders        Ordered    diphenhydrAMINE (BENADRYL) 25 mg capsule  Every 6 hours PRN     09/08/17 0733    metoCLOPramide (REGLAN) 10 MG tablet  Every 6 hours PRN     09/08/17 0733      Portions of this note were generated with dragon dictation software. Dictation errors may occur despite best attempts at proofreading.    Sharman Cheek, MD 09/08/17 [phone]

## 2017-09-08 NOTE — ED Notes (Signed)
Pt denies further needs. 0/10 pain

## 2017-09-08 NOTE — ED Notes (Signed)
Pt alert and oriented X4, active, cooperative, pt in NAD. RR even and unlabored, color WNL.  Pt informed to return if any life threatening symptoms occur.  Discharge and followup instructions reviewed.  

## 2017-09-08 NOTE — ED Notes (Signed)
Pt sleeping at this time, audible snoring can be heard at this time.

## 2017-09-13 ENCOUNTER — Telehealth: Payer: Self-pay

## 2017-09-13 NOTE — Telephone Encounter (Signed)
Pt scheduled  

## 2017-09-13 NOTE — Telephone Encounter (Signed)
Lm with cousin to have patient call back and schedule ED fu  Seen on 09/08/17 for CP   Will call again at a later time

## 2017-10-31 DIAGNOSIS — F17200 Nicotine dependence, unspecified, uncomplicated: Secondary | ICD-10-CM | POA: Insufficient documentation

## 2017-10-31 DIAGNOSIS — R079 Chest pain, unspecified: Secondary | ICD-10-CM | POA: Insufficient documentation

## 2017-10-31 DIAGNOSIS — I10 Essential (primary) hypertension: Secondary | ICD-10-CM | POA: Insufficient documentation
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
## 2017-11-01 ENCOUNTER — Ambulatory Visit (INDEPENDENT_AMBULATORY_CARE_PROVIDER_SITE_OTHER): Payer: Self-pay | Admitting: Cardiovascular Disease

## 2017-11-01 ENCOUNTER — Encounter

## 2017-11-01 ENCOUNTER — Encounter: Payer: Self-pay | Admitting: Cardiovascular Disease

## 2017-11-01 VITALS — BP 130/84 | HR 82 | Ht 66.0 in | Wt 346.2 lb

## 2017-11-01 DIAGNOSIS — F17200 Nicotine dependence, unspecified, uncomplicated: Secondary | ICD-10-CM

## 2017-11-01 DIAGNOSIS — R079 Chest pain, unspecified: Secondary | ICD-10-CM

## 2017-11-01 DIAGNOSIS — K219 Gastro-esophageal reflux disease without esophagitis: Secondary | ICD-10-CM | POA: Insufficient documentation

## 2017-11-01 DIAGNOSIS — K2100 Gastro-esophageal reflux disease with esophagitis, without bleeding: Secondary | ICD-10-CM

## 2017-11-01 DIAGNOSIS — I10 Essential (primary) hypertension: Secondary | ICD-10-CM

## 2017-11-01 MED ORDER — OMEPRAZOLE 20 MG PO CPDR: 20.0000 mg | DELAYED_RELEASE_CAPSULE | Freq: Two times a day (BID) | ORAL | 6 refills | Status: DC

## 2017-11-01 NOTE — Progress Notes (Signed)
Patient brought wrong paperwork back into office and signed form. Information entered into [employer] and paperwork given to [place].

## 2017-11-01 NOTE — Progress Notes (Signed)
Patient given wrong AVS. Attempted to call several times to request paperwork back but no answer. Left voicemail messages for him to please call back.

## 2017-11-01 NOTE — Patient Instructions (Signed)
We will help arrange an appt with pulmonary for sleep apnea workup   Medication Instructions:   We will start omeprazole one pill twice a day until burning is better Then try one a day Watch the spicy foods  Labwork:  No new labs needed  Testing/Procedures:  No further testing at this time   Follow-Up: It was a pleasure seeing you in the office today. Please call us if you have new issues that need to be addressed before your next appt.  [phone]  Your physician wants you to follow-up in:  As needed  If you need a refill on your cardiac medications before your next appointment, please call your pharmacy.  For educational health videos Log in to : [email or web address] Or : [email or web address], password : [email or web address]

## 2017-11-03 ENCOUNTER — Institutional Professional Consult (permissible substitution): Payer: Self-pay | Admitting: Internal Medicine

## 2017-11-07 ENCOUNTER — Encounter: Payer: Self-pay | Admitting: Internal Medicine

## 2017-11-07 ENCOUNTER — Ambulatory Visit (INDEPENDENT_AMBULATORY_CARE_PROVIDER_SITE_OTHER): Payer: Self-pay | Admitting: Internal Medicine

## 2017-11-07 VITALS — BP 110/64 | HR 86 | Resp 16 | Ht 66.0 in | Wt 350.0 lb

## 2017-11-07 DIAGNOSIS — G4719 Other hypersomnia: Secondary | ICD-10-CM

## 2017-11-07 NOTE — Patient Instructions (Addendum)
You likely have severe sleep apnea, will see if we can start you on CPAP.  You should see if you would be eligible for medicaid.   You should try to quit smoking again.     Sleep Apnea       Sleep apnea is disorder that affects a person's sleep. A person with sleep apnea has abnormal pauses in their breathing when they sleep. It is hard for them to get a good sleep. This makes a person tired during the day. It also can lead to other physical problems. There are three types of sleep apnea. One type is when breathing stops for a short time because your airway is blocked (obstructive sleep apnea). Another type is when the brain sometimes fails to give the normal signal to breathe to the muscles that control your breathing (central sleep apnea). The third type is a combination of the other two types. HOME CARE   Take all medicine as told by your doctor.  Avoid alcohol, calming medicines (sedatives), and depressant drugs.  Try to lose weight if you are overweight. Talk to your doctor about a healthy weight goal.  Your doctor may have you use a device that helps to open your airway. It can help you get the air that you need. It is called a positive airway pressure (PAP) device.   MAKE SURE YOU:   Understand these instructions.  Will watch your condition.  Will get help right away if you are not doing well or get worse.  It may take approximately 1 month for you to get used to wearing her CPAP every night.  Be sure to work with your machine to get used to it, be patient, it may take time!

## 2017-11-07 NOTE — Progress Notes (Signed)
[facility] [place] [facility] Medicine Consultation      Assessment and Plan:  Excessive daytime sleepiness.  -Symptoms and signs of obstructive sleep apnea. - We will send for urgent sleep study and starting on CPAP.  Given severity of daytime sleepiness, and likely underlying sleep apnea the patient is at high risk of complications.  Nicotine Abuse.  -Discussed the importance of smoke cessation, spent a 3 minutes in discussion.  He has quit for up to 2 months in the past, encouraged to try again.  Morbid obesity.  - Likely contributing to sleep apnea, discussed importance of weight loss.  Orders Placed This Encounter  Procedures  . Home sleep test   Return in about 3 months (around 02/07/2018).    Date: 11/07/2017  MRN# [id number] [name]  Referring Physician:  Dr. Mariah Milling.   [name] is a 42 y.o. old male seen in consultation for chief complaint of:    Chief Complaint  Patient presents with  . Consult    Pt needs eval for possible sleep apnea.  Marland Kitchen excessive daytime sleepiness    pt snores and spouse has witnessed apnea.    HPI:   The patient is a 42 year old male referred for symptoms of excessive daytime sleepiness.  Patient typically goes to bed around midnight, falls asleep within a few minutes but wakes up multiple times during the night.  He then sleeps on and off for much of the day.  Epworth score is very elevated at 23 today. He is present with his girlfriend who gives some of the history. He sits up much of the night because that is the only way that he can sleep. He falls asleep a lot during the day, and driving, but has never had an accident. 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  He snoers at night and stops breathing in his sleep. He sees cardiology for chest pain thought to be from GERD. He has gained about 50 lbs in about a year.   He is smoking about a ppd.  He is thinking of quitting, he has quit for 2 months in the past cold [place]. He restarted for uncertain reasons.   He does not have insurance and can not afford a CPAP out of pocket.    PMHX:   History reviewed. No pertinent past medical history. Surgical Hx:  History reviewed. No pertinent surgical history. Family Hx:  Family History  Problem Relation Age of Onset  . Heart attack Mother   . Heart disease Mother   . Hypertension Mother    Social Hx:   Social History   Tobacco Use  . Smoking status: Current Every Day Smoker    Packs/day: 1.00    Years: 28.00    Pack years: 28.00    Types: Cigarettes  . Smokeless tobacco: Never Used  Substance Use Topics  . Alcohol use: No  . Drug use: Never   Medication:    Current Outpatient Medications:  .  cyclobenzaprine (FLEXERIL) 10 MG tablet, Take 1 tablet (10 mg total) by mouth 3 (three) times daily as needed for muscle spasms., Disp: 12 tablet, Rfl: 0 .  diphenhydrAMINE (BENADRYL) 25 mg capsule, Take 2 capsules (50 mg total) by mouth every 6 (six) hours as needed., Disp: 60 capsule, Rfl: 0 .  HYDROcodone-acetaminophen (NORCO) 5-325 MG tablet, Take 1 tablet by mouth every 4 (four) hours as needed for moderate pain., Disp: 6  tablet, Rfl: 0 .  omeprazole (PRILOSEC) 20 MG capsule, Take 1 capsule (20 mg total) by mouth 2 (two) times daily before a meal., Disp: 60 capsule, Rfl: 6   Allergies:  Patient has no known allergies.  Review of Systems: Gen:  Denies  fever, sweats, chills HEENT: Denies blurred vision, double vision. bleeds, sore throat Cvc:  No dizziness, chest pain. Resp:   Denies cough or sputum production, shortness of breath Gi: Denies swallowing difficulty, stomach pain. Gu:  Denies bladder incontinence, burning urine Ext:   No Joint pain, stiffness. Skin: No skin rash,  hives  Endoc:  No polyuria, polydipsia. Psych: No depression, insomnia. Other:  All other systems were reviewed with the patient and were negative  other that what is mentioned in the HPI.   Physical Examination:   VS: BP 110/64 (BP Location: Left Arm, Cuff Size: Large)   Pulse 86   Resp 16   Ht 5\' 6"  (1.676 m)   Wt (!) 350 lb (158.8 kg)   SpO2 90%   BMI 56.49 kg/m   General Appearance: No distress  Neuro:without focal findings,  speech normal,  HEENT: PERRLA, EOM intact.   Pulmonary: normal breath sounds, No wheezing.  CardiovascularNormal S1,S2.  No m/r/g.   Abdomen: Benign, Soft, non-tender. Renal:  No costovertebral tenderness  GU:  No performed at this time. Endoc: No evident thyromegaly, no signs of acromegaly. Skin:   warm, no rashes, no ecchymosis  Extremities: normal, no cyanosis, clubbing.  Other findings:    LABORATORY PANEL:   CBC No results for input(s): WBC, HGB, HCT, PLT in the last 168 hours. ------------------------------------------------------------------------------------------------------------------  Chemistries  No results for input(s): NA, K, CL, CO2, GLUCOSE, BUN, CREATININE, CALCIUM, MG, AST, ALT, ALKPHOS, BILITOT in the last 168 hours.  Invalid input(s): GFRCGP ------------------------------------------------------------------------------------------------------------------  Cardiac Enzymes No results for input(s): TROPONINI in the last 168 hours. ------------------------------------------------------------  RADIOLOGY:  No results found.     Thank  you for the consultation and for allowing [facility] to assist in the care of your patient. Our recommendations are noted above.  Please contact us if we can be of further service.   Wells Guileseep Calan Doren, MD.  Board Certified in Internal Medicine, Pulmonary Medicine, Critical Care Medicine, and Sleep Medicine.  [place] [facility] Office Number: [phone]  Santiago Gladavid Kasa, M.D.  Billy Fischeravid Simonds, M.D  11/07/2017

## 2017-11-28 ENCOUNTER — Encounter: Payer: Self-pay | Admitting: Internal Medicine

## 2017-11-28 DIAGNOSIS — G4719 Other hypersomnia: Secondary | ICD-10-CM

## 2017-11-28 DIAGNOSIS — G4733 Obstructive sleep apnea (adult) (pediatric): Secondary | ICD-10-CM

## 2017-12-01 ENCOUNTER — Telehealth: Payer: Self-pay | Admitting: *Deleted

## 2017-12-01 DIAGNOSIS — G4733 Obstructive sleep apnea (adult) (pediatric): Secondary | ICD-10-CM

## 2017-12-01 NOTE — Telephone Encounter (Signed)
Pt is uninsured. He can't afford $100 cpap assistance program. He did turn in application for charity care which is pending. Encourage patient to call and find out status. He was told to call office back once approved.

## 2017-12-01 NOTE — Telephone Encounter (Signed)
LMOM for pt to return call for sleep results. Pt has severe sleep apnea with AHI of 89.7 and as high as 100 in supine position.   Recommendations are auto CPAP of 5-20 cm H2o and recommend ONO on CPAP.

## 2017-12-01 NOTE — Telephone Encounter (Signed)
Pt is returning the call.  

## 2018-04-11 NOTE — Progress Notes (Signed)
[facility]     Assessment and Plan:  Obstructive sleep apnea. -HST 11/28/2017 AHI of 90 consistent with severe obstructive sleep apnea. - We will start urgent CPAP as soon as possible.  Uvular swelling. - Do not appreciate any evidence of significant airway edema or impending airway compromise. -I suspect that the patient's CT scan swollen uvula may be secondary to severe obstructive sleep apnea.  However I will give the patient a course of antibiotics to treat any underlying infection.  Nicotine abuse. -Discussed the importance of smoke cessation, spent 3 minutes in discussion.  Currently is continuing to smoke half pack a day, not really serious about quitting at this time.  Morbid obesity. - Likely contributing to sleep apnea, discussed importance of weight loss.  Meds ordered this encounter  Medications  . amoxicillin-clavulanate (AUGMENTIN) 875-125 MG tablet    Sig: Take 1 tablet by mouth 2 (two) times daily for 7 days.    Dispense:  14 tablet    Refill:  0    Return in about 2 months (around 06/16/2018).    Date: 04/11/2018  MRN# [id number] [name] 07/04/1975  Referring Physician:  Dr. Mariah MillingGollan.   [name] is a 42 y.o. old male seen in consultation for chief complaint of:    Chief Complaint  Patient presents with  . Shortness of Breath    unchanged; pt called on Friday and stated it felt as if his throat was closing up. Pt was advised to go to ED.   Marland Kitchen. Sleep Apnea    pt is not set up on cpap therapy.    HPI:  The patient is a 42 year old male smoker with morbid obesity, excessive daytime sleepiness.  Last visit he was sent for a home sleep test which showed severe obstructive sleep apnea with AHI of 90, as well as a component of central sleep apnea with a central apnea index of 9.5.  He does not have insurance, he was started on a trial of auto CPAP with pressure range 5-20.  He had his sleep study which showed  severe OSA and was recommended to be on CPAP. However about 4 days ago, he felt that his throat was closing up. He was recommend to to ED however he decided to wait the weekend and come in today, he feels better today. However he is having headaches, and feels that his uvula is swollen. He continues to smoke about half ppd.    He is smoking about a ppd. He is thinking of quitting, he has quit for 2 months in the past cold [place]. He restarted for uncertain reasons.   He does not have insurance and can not afford a CPAP out of pocket.   **HST 11/28/2017>> severe OSA with AHI of 90, central apnea index of 9.5.  Recommended use of auto CPAP with pressure range 5-20.  Medication:    Current Outpatient Medications:  .  cyclobenzaprine (FLEXERIL) 10 MG tablet, Take 1 tablet (10 mg total) by mouth 3 (three) times daily as needed for muscle spasms., Disp: 12 tablet, Rfl: 0 .  diphenhydrAMINE (BENADRYL) 25 mg capsule, Take 2 capsules (50 mg total) by mouth every 6 (six) hours as needed., Disp: 60 capsule, Rfl: 0 .  HYDROcodone-acetaminophen (NORCO) 5-325 MG tablet, Take 1 tablet by mouth every 4 (four) hours as needed for moderate pain., Disp: 6 tablet, Rfl: 0 .  omeprazole (PRILOSEC) 20 MG capsule, Take 1 capsule (20 mg total) by mouth 2 (two)  times daily before a meal., Disp: 60 capsule, Rfl: 6   Allergies:  Patient has no known allergies.  Review of Systems:  Constitutional: Feels well. Cardiovascular: Denies chest pain, exertional chest pain.  Pulmonary: Denies hemoptysis, pleuritic chest pain.   The remainder of systems were reviewed and were found to be negative other than what is documented in the HPI.    Physical Examination:   VS: BP 112/60 (BP Location: Left Arm, Cuff Size: Large)   Pulse 80   Resp 16   Ht 5\' 6"  (1.676 m)   Wt (!) 354 lb (160.6 kg)   SpO2 98%   BMI 57.14 kg/m   General Appearance: No distress  Neuro:without focal findings, mental status, speech normal, alert and  oriented HEENT: PERRLA, EOM intact mild enlargement of uvula. Pulmonary: No wheezing, No rales  CardiovascularNormal S1,S2.  No m/r/g.  Abdomen: Benign, Soft, non-tender, No masses Renal:  No costovertebral tenderness  GU:  No performed at this time. Endoc: No evident thyromegaly, no signs of acromegaly or Cushing features Skin:   warm, no rashes, no ecchymosis  Extremities: normal, no cyanosis, clubbing.      LABORATORY PANEL:   CBC No results for input(s): WBC, HGB, HCT, PLT in the last 168 hours. ------------------------------------------------------------------------------------------------------------------  Chemistries  No results for input(s): NA, K, CL, CO2, GLUCOSE, BUN, CREATININE, CALCIUM, MG, AST, ALT, ALKPHOS, BILITOT in the last 168 hours.  Invalid input(s): GFRCGP ------------------------------------------------------------------------------------------------------------------  Cardiac Enzymes No results for input(s): TROPONINI in the last 168 hours. ------------------------------------------------------------  RADIOLOGY:  No results found.     Thank  you for the consultation and for allowing [facility] to assist in the care of your patient. Our recommendations are noted above.  Please contact us if we can be of further service.  Wells Guiles, M.D., F.C.C.P.  Board Certified in Internal Medicine, Pulmonary Medicine, Critical Care Medicine, and Sleep Medicine.  [place] [facility] Office Number: [phone]  04/11/2018

## 2018-04-13 ENCOUNTER — Telehealth: Payer: Self-pay | Admitting: Internal Medicine

## 2018-04-13 NOTE — Telephone Encounter (Signed)
Patient called to confirm appt with Ram on Monday and expresses concerns of swelling in thoat causing sob sounds sob via phone

## 2018-04-13 NOTE — Telephone Encounter (Signed)
KW spoke with patient and advised to go to ED. Pt says feels like notch in the back of his throat is stuck to his tongue. Again he was advised to go to ED. Pt will keep appt at as scheduled on Monday with DR.

## 2018-04-16 ENCOUNTER — Ambulatory Visit (INDEPENDENT_AMBULATORY_CARE_PROVIDER_SITE_OTHER): Payer: Self-pay | Admitting: Internal Medicine

## 2018-04-16 ENCOUNTER — Encounter: Payer: Self-pay | Admitting: Internal Medicine

## 2018-04-16 VITALS — BP 112/60 | HR 80 | Resp 16 | Ht 66.0 in | Wt 354.0 lb

## 2018-04-16 DIAGNOSIS — G4733 Obstructive sleep apnea (adult) (pediatric): Secondary | ICD-10-CM

## 2018-04-16 DIAGNOSIS — K1379 Other lesions of oral mucosa: Secondary | ICD-10-CM

## 2018-04-16 DIAGNOSIS — F17210 Nicotine dependence, cigarettes, uncomplicated: Secondary | ICD-10-CM

## 2018-04-16 MED ORDER — AMOXICILLIN-POT CLAVULANATE 875-125 MG PO TABS: 1.0000 | ORAL_TABLET | Freq: Two times a day (BID) | ORAL | 0 refills | Status: AC

## 2018-04-16 NOTE — Patient Instructions (Signed)
Will give you a course of antibiotics.  Will need to start CPAP ASAP.

## 2018-07-03 NOTE — Progress Notes (Deleted)
[facility] [place] [facility]     Assessment and Plan:  Obstructive sleep apnea. -HST 11/28/2017 AHI of 90 consistent with severe obstructive sleep apnea. - We will start urgent CPAP as soon as possible.  Uvular swelling. - Do not appreciate any evidence of significant airway edema or impending airway compromise. -I suspect that the patient's CT scan swollen uvula may be secondary to severe obstructive sleep apnea.  However I will give the patient a course of antibiotics to treat any underlying infection.  Nicotine abuse. -Discussed the importance of smoke cessation, spent 3 minutes in discussion.  Currently is continuing to smoke half pack a day, not really serious about quitting at this time.  Morbid obesity. - Likely contributing to sleep apnea, discussed importance of weight loss.  No orders of the defined types were placed in this encounter.   No follow-ups on file.    Date: 07/03/2018  MRN# [id number] [name] Kort Oct 26, 1975  Referring Physician:  Dr. Mariah Milling.   [name] is a 43 y.o. old male seen in consultation for chief complaint of:    No chief complaint on file.   HPI:  The patient is a 43 year old male smoker with morbid obesity, excessive daytime sleepiness.  Last visit he was sent for a home sleep test which showed severe obstructive sleep apnea with AHI of 90, as well as a component of central sleep apnea with a central apnea index of 9.5.  He does not have insurance, he was started on a trial of auto CPAP with pressure range 5-20.  He had his sleep study which showed severe OSA and was recommended to be on CPAP. However about 4 days ago, he felt that his throat was closing up. He was recommend to to ED however he decided to wait the weekend and come in today, he feels better today. However he is having headaches, and feels that his uvula is swollen. He continues to smoke about half ppd.    He is smoking about a ppd. He is thinking  of quitting, he has quit for 2 months in the past cold [place]. He restarted for uncertain reasons.   He does not have insurance and can not afford a CPAP out of pocket.   **HST 11/28/2017>> severe OSA with AHI of 90, central apnea index of 9.5.  Recommended use of auto CPAP with pressure range 5-20.  Medication:    Current Outpatient Medications:  .  cyclobenzaprine (FLEXERIL) 10 MG tablet, Take 1 tablet (10 mg total) by mouth 3 (three) times daily as needed for muscle spasms., Disp: 12 tablet, Rfl: 0 .  diphenhydrAMINE (BENADRYL) 25 mg capsule, Take 2 capsules (50 mg total) by mouth every 6 (six) hours as needed., Disp: 60 capsule, Rfl: 0 .  HYDROcodone-acetaminophen (NORCO) 5-325 MG tablet, Take 1 tablet by mouth every 4 (four) hours as needed for moderate pain., Disp: 6 tablet, Rfl: 0 .  omeprazole (PRILOSEC) 20 MG capsule, Take 1 capsule (20 mg total) by mouth 2 (two) times daily before a meal., Disp: 60 capsule, Rfl: 6   Allergies:  Patient has no known allergies.    LABORATORY PANEL:   CBC No results for input(s): WBC, HGB, HCT, PLT in the last 168 hours. ------------------------------------------------------------------------------------------------------------------  Chemistries  No results for input(s): NA, K, CL, CO2, GLUCOSE, BUN, CREATININE, CALCIUM, MG, AST, ALT, ALKPHOS, BILITOT in the last 168 hours.  Invalid input(s): GFRCGP ------------------------------------------------------------------------------------------------------------------  Cardiac Enzymes No results for input(s): TROPONINI in the last 168 hours. ------------------------------------------------------------  RADIOLOGY:  No results found.     Thank  you for the consultation and for allowing [facility] to assist in the care of your patient. Our recommendations are noted above.  Please contact us if we can be of further service.  Wells Guileseep Kyair Ditommaso, M.D., F.C.C.P.  Board  Certified in Internal Medicine, Pulmonary Medicine, Critical Care Medicine, and Sleep Medicine.  [place] [facility] Office Number: [phone]  07/03/2018

## 2018-07-04 ENCOUNTER — Ambulatory Visit: Payer: Self-pay | Admitting: Internal Medicine

## 2018-07-18 ENCOUNTER — Encounter: Payer: Self-pay | Admitting: Internal Medicine

## 2019-07-15 ENCOUNTER — Other Ambulatory Visit: Payer: Self-pay

## 2019-07-15 ENCOUNTER — Emergency Department: Payer: Self-pay

## 2019-07-15 ENCOUNTER — Emergency Department
Admission: EM | Admit: 2019-07-15 | Discharge: 2019-07-15 | Disposition: A | Discharge status: Home / Self Care | Payer: Self-pay | Attending: Emergency Medicine | Admitting: Emergency Medicine

## 2019-07-15 ENCOUNTER — Encounter: Payer: Self-pay | Admitting: Emergency Medicine

## 2019-07-15 DIAGNOSIS — K219 Gastro-esophageal reflux disease without esophagitis: Secondary | ICD-10-CM | POA: Insufficient documentation

## 2019-07-15 DIAGNOSIS — I10 Essential (primary) hypertension: Secondary | ICD-10-CM | POA: Insufficient documentation

## 2019-07-15 DIAGNOSIS — K2970 Gastritis, unspecified, without bleeding: Secondary | ICD-10-CM | POA: Insufficient documentation

## 2019-07-15 LAB — CBC
HCT: 43.2 % (ref 39.0–52.0)
Hemoglobin: 13.2 g/dL (ref 13.0–17.0)
MCH: 25.5 pg — ABNORMAL LOW (ref 26.0–34.0)
MCHC: 30.6 g/dL (ref 30.0–36.0)
MCV: 83.4 fL (ref 80.0–100.0)
Platelets: 283 10*3/uL (ref 150–400)
RBC: 5.18 MIL/uL (ref 4.22–5.81)
RDW: 14.5 % (ref 11.5–15.5)
WBC: 10.8 10*3/uL — ABNORMAL HIGH (ref 4.0–10.5)
nRBC: 0 % (ref 0.0–0.2)

## 2019-07-15 LAB — BASIC METABOLIC PANEL
Anion gap: 11 (ref 5–15)
BUN: 7 mg/dL (ref 6–20)
CO2: 28 mmol/L (ref 22–32)
Calcium: 9 mg/dL (ref 8.9–10.3)
Chloride: 103 mmol/L (ref 98–111)
Creatinine, Ser: 0.69 mg/dL (ref 0.61–1.24)
GFR calc Af Amer: >60 mL/min (ref >60)
GFR calc non Af Amer: >60 mL/min (ref >60)
Glucose, Bld: 117 mg/dL — ABNORMAL HIGH (ref 70–99)
Potassium: 3.7 mmol/L (ref 3.5–5.1)
Sodium: 142 mmol/L (ref 135–145)

## 2019-07-15 LAB — TROPONIN I (HIGH SENSITIVITY): Troponin I (High Sensitivity): 7 ng/L (ref <18)

## 2019-07-15 MED ORDER — ALUM & MAG HYDROXIDE-SIMETH 200-200-20 MG/5ML PO SUSP
30.0000 mL | Freq: Once | ORAL | Status: AC
  Administered 2019-07-15: 30 mL via ORAL
  Filled 2019-07-15: qty 30

## 2019-07-15 MED ORDER — SUCRALFATE 1 G PO TABS: 1.0000 g | ORAL_TABLET | Freq: Four times a day (QID) | ORAL | 0 refills | Status: DC

## 2019-07-15 MED ORDER — LIDOCAINE VISCOUS HCL 2 % MT SOLN
15.0000 mL | Freq: Once | OROMUCOSAL | Status: AC
  Administered 2019-07-15: 15 mL via ORAL
  Filled 2019-07-15: qty 15

## 2019-07-15 MED ORDER — FAMOTIDINE 20 MG PO TABS: 20.0000 mg | ORAL_TABLET | Freq: Every day | ORAL | 1 refills | Status: DC

## 2019-07-15 NOTE — ED Triage Notes (Signed)
Patient presents to the ED with sharp mid-sternal chest pain x 3 days.  Patient states pain is worse when he turns to the right or left.  Denies worsening with deep breathing.  Patient denies known injury.

## 2019-07-15 NOTE — ED Notes (Signed)
Pt reports central/upper sharp chest pain that is intermittent. States this has been going on for 3 days. Denies radiation to other areas. States when he swallows or moves it makes the pain worse. Denies difficulty swallowing. Pt currently in NAD; sitting calmly in chair.

## 2019-07-15 NOTE — ED Provider Notes (Signed)
[facility] Emergency Department Provider Note  ____________________________________________   I have reviewed the triage vital signs and the nursing notes.   HISTORY  Chief Complaint Chest Pain   History limited by: Not Limited   HPI [name] is a 44 y.o. male who presents to the emergency department today because of concerns for chest pain.  The chest pain is located in the center part of his chest.  States the pain is been present for the past few days.  He has noticed that the pain is somewhat worse with movement.  He also has noticed that the pain is worse when he tries to swallow.  He has not noticed any change in the pain depending on what he is trying to swallow.  Patient denies similar pain in this past.  Denies any fevers.  Denies any trauma to his chest.  Records reviewed. Per medical record review patient has a history of GERD  History reviewed. No pertinent past medical history.  Patient Active Problem List   Diagnosis Date Noted  . Morbid obesity ([place]) 11/01/2017  . GERD (gastroesophageal reflux disease) 11/01/2017  . Chest pain with moderate risk for cardiac etiology 10/31/2017  . Smoker 10/31/2017  . Essential hypertension 10/31/2017    History reviewed. No pertinent surgical history.  Prior to Admission medications   Medication Sig Start Date End Date Taking? Authorizing Provider  cyclobenzaprine (FLEXERIL) 10 MG tablet Take 1 tablet (10 mg total) by mouth 3 (three) times daily as needed for muscle spasms. 11/13/16   Merlyn Lot, MD  diphenhydrAMINE (BENADRYL) 25 mg capsule Take 2 capsules (50 mg total) by mouth every 6 (six) hours as needed. 09/08/17   Carrie Mew, MD  HYDROcodone-acetaminophen (NORCO) 5-325 MG tablet Take 1 tablet by mouth every 4 (four) hours as needed for moderate pain. 11/13/16   Merlyn Lot, MD  omeprazole (PRILOSEC) 20 MG capsule Take 1 capsule (20 mg total) by mouth 2 (two) times  daily before a meal. 11/01/17   Gollan, Kathlene November, MD    Allergies Patient has no known allergies.  Family History  Problem Relation Age of Onset  . Heart attack Mother   . Heart disease Mother   . Hypertension Mother     Social History Social History   Tobacco Use  . Smoking status: Former Smoker    Packs/day: 1.00    Years: 28.00    Pack years: 28.00    Types: Cigarettes    Quit date: 04/14/2019    Years since quitting: 0.2  . Smokeless tobacco: Never Used  Substance Use Topics  . Alcohol use: Yes    Comment: rarely  . Drug use: Never    Review of Systems Constitutional: No fever/chills Eyes: No visual changes. ENT: No sore throat. Cardiovascular: Positive for chest pain. Respiratory: Denies shortness of breath. Gastrointestinal: No abdominal pain.  No nausea, no vomiting.  No diarrhea.   Genitourinary: Negative for dysuria. Musculoskeletal: Negative for back pain. Skin: Negative for rash. Neurological: Negative for headaches, focal weakness or numbness.  ____________________________________________   PHYSICAL EXAM:  VITAL SIGNS: ED Triage Vitals  Enc Vitals Group     BP 07/15/19 1728 125/64     Pulse Rate 07/15/19 1728 88     Resp 07/15/19 1728 20     Temp 07/15/19 1728 98.4 F (36.9 C)     Temp Source 07/15/19 1728 Oral     SpO2 07/15/19 1728 95 %     Weight 07/15/19 1730 (!)  357 lb (161.9 kg)     Height 07/15/19 1730 5\' 6"  (1.676 m)     Head Circumference --      Peak Flow --      Pain Score 07/15/19 1729 7   Constitutional: Alert and oriented.  Eyes: Conjunctivae are normal.  ENT      Head: Normocephalic and atraumatic.      Nose: No congestion/rhinnorhea.      Mouth/Throat: Mucous membranes are moist.      Neck: No stridor. Hematological/Lymphatic/Immunilogical: No cervical lymphadenopathy. Cardiovascular: Normal rate, regular rhythm.  No murmurs, rubs, or gallops.  Respiratory: Normal respiratory effort without tachypnea nor retractions.  Breath sounds are clear and equal bilaterally. No wheezes/rales/rhonchi. Gastrointestinal: Soft and non tender. No rebound. No guarding.  Genitourinary: Deferred Musculoskeletal: Normal range of motion in all extremities. No lower extremity edema. Neurologic:  Normal speech and language. No gross focal neurologic deficits are appreciated.  Skin:  Skin is warm, dry and intact. No rash noted. Psychiatric: Mood and affect are normal. Speech and behavior are normal. Patient exhibits appropriate insight and judgment.  ____________________________________________    LABS (pertinent positives/negatives)  Trop hs 7 BMP wnl except glu 117 CBC wbc 10.8, hgb 13.2, plt 283  ____________________________________________   EKG  I, 07/17/19, attending physician, personally viewed and interpreted this EKG  EKG Time: 1724 Rate: 89 Rhythm: normal sinus rhythm Axis: normal Intervals: qtc 462 QRS: narrow ST changes: no st elevation Impression: normal ekg  ____________________________________________    RADIOLOGY  CXR No acute abnormality  ____________________________________________   PROCEDURES  Procedures  ____________________________________________   INITIAL IMPRESSION / ASSESSMENT AND PLAN / ED COURSE  Pertinent labs & imaging results that were available during my care of the patient were reviewed by me and considered in my medical decision making (see chart for details).   Patient presented to the emergency department today because of concerns for chest pain.  Differential would be broad including ACS, PE, dissection, GERD, costochondritis amongst other etiologies.  Patient EKG, chest x-ray and blood work without any concerning findings.  Patient was given GI cocktail and did feel significant improvement.  At this point I do think gastritis likely the cause of the patient's symptoms.  Will plan on discharging with Pepcid and  sucralfate. ____________________________________________   FINAL CLINICAL IMPRESSION(S) / ED DIAGNOSES  Final diagnoses:  Gastritis, presence of bleeding unspecified, unspecified chronicity, unspecified gastritis type     Note: This dictation was prepared with Dragon dictation. Any transcriptional errors that result from this process are unintentional     Phineas Semen, MD 07/15/19 2128

## 2019-07-15 NOTE — Discharge Instructions (Addendum)
Please seek medical attention for any high fevers, chest pain, shortness of breath, change in behavior, persistent vomiting, bloody stool or any other new or concerning symptoms.  

## 2019-08-08 ENCOUNTER — Other Ambulatory Visit: Payer: Self-pay

## 2019-08-08 ENCOUNTER — Encounter: Payer: Self-pay | Admitting: Internal Medicine

## 2019-08-08 ENCOUNTER — Ambulatory Visit (INDEPENDENT_AMBULATORY_CARE_PROVIDER_SITE_OTHER): Payer: Medicaid Other | Admitting: Internal Medicine

## 2019-08-08 VITALS — BP 126/74 | HR 90 | Temp 98.4°F | Ht 64.5 in | Wt 366.8 lb

## 2019-08-08 DIAGNOSIS — G4733 Obstructive sleep apnea (adult) (pediatric): Secondary | ICD-10-CM

## 2019-08-08 NOTE — Patient Instructions (Signed)
RECOMMEND ESTABLISH CARE WITH PCP AT [facility] ASSESS FOR INSURANCE NEEDS NAD OTHER HEALTH CARE NEEDS

## 2019-08-08 NOTE — Progress Notes (Signed)
[facility]   Date: 08/08/2019  MRN# [id number] [name] 1975-06-24  Referring Physician:  Dr. Rockey Situ.   [name] is a 44 y.o. old male seen in consultation for chief complaint of:    SYNOPSIS July 2019 SEVERE OSA AHI 90 727 DESATURATION EPISODES   CC  follow up SEVERE OSA  HPI:    44 year old morbidly obese African-American male with signs symptoms of severe sleep apnea Previous AHI was 90 At this time patient does not have medical insurance nor does he have a primary care doctor to afford supplies   I have explained to patient that we do not have supplies in the office and that he needs to establish care with a primary care physician and I recommend the [facility] where he can establish care and seek assistance for medical insurance   No evidence of heart failure at this time No evidence or signs of infection at this time No respiratory distress No fevers, chills, nausea, vomiting, diarrhea No evidence of lower extremity edema No evidence hemoptysis     **HST 11/28/2017>> severe OSA with AHI of 90, central apnea index of 9.5.  Recommended use of auto CPAP with pressure range 5-20.  He does not have insurance and can not afford a CPAP out of pocket.   Medication:    Current Outpatient Medications:  .  cyclobenzaprine (FLEXERIL) 10 MG tablet, Take 1 tablet (10 mg total) by mouth 3 (three) times daily as needed for muscle spasms., Disp: 12 tablet, Rfl: 0 .  diphenhydrAMINE (BENADRYL) 25 mg capsule, Take 2 capsules (50 mg total) by mouth every 6 (six) hours as needed., Disp: 60 capsule, Rfl: 0 .  famotidine (PEPCID) 20 MG tablet, Take 1 tablet (20 mg total) by mouth daily., Disp: 30 tablet, Rfl: 1 .  HYDROcodone-acetaminophen (NORCO) 5-325 MG tablet, Take 1 tablet by mouth every 4 (four) hours as needed for moderate pain., Disp: 6 tablet, Rfl: 0 .  omeprazole (PRILOSEC) 20 MG capsule, Take 1 capsule (20 mg  total) by mouth 2 (two) times daily before a meal., Disp: 60 capsule, Rfl: 6 .  sucralfate (CARAFATE) 1 g tablet, Take 1 tablet (1 g total) by mouth 4 (four) times daily., Disp: 60 tablet, Rfl: 0   Allergies:  Patient has no known allergies.   Review of Systems:  Gen:  Denies  fever, sweats, chills weight loss  HEENT: Denies blurred vision, double vision, ear pain, eye pain, hearing loss, nose bleeds, sore throat Cardiac:  No dizziness, chest pain or heaviness, chest tightness,edema, No JVD Resp:   No cough, -sputum production, -shortness of breath,-wheezing, -hemoptysis,  Gi: Denies swallowing difficulty, stomach pain, nausea or vomiting, diarrhea, constipation, bowel incontinence Gu:  Denies bladder incontinence, burning urine Ext:   Denies Joint pain, stiffness or swelling Skin: Denies  skin rash, easy bruising or bleeding or hives Endoc:  Denies polyuria, polydipsia , polyphagia or weight change Psych:   Denies depression, insomnia or hallucinations  Other:  All other systems negative   Physical Examination:   BP 126/74 (BP Location: Right Arm, Cuff Size: Large)   Pulse 90   Temp 98.4 F (36.9 C) (Temporal)   Ht 5' 4.5" (1.638 m)   Wt (!) 366 lb 12.8 oz (166.4 kg)   SpO2 96%   BMI 61.99 kg/m   Physical Examination:   General Appearance: No distress  Neuro:without focal findings,  speech normal,  HEENT: PERRLA, EOM intact.   Pulmonary: normal breath  sounds, No wheezing.  CardiovascularNormal S1,S2.  No m/r/g.   Abdomen: Benign, Soft, non-tender. Renal:  No costovertebral tenderness  GU:  Not performed at this time. Endoc: No evident thyromegaly Skin:   warm, no rashes, no ecchymosis  Extremities: normal, no cyanosis, clubbing. PSYCHIATRIC: Mood, affect within normal limits.   ALL OTHER ROS ARE NEGATIVE        Assessment and Plan:  SEVERE OSA HST AHI 90 c/w with severe OSA He will need to start AUTOCPAP ASAP!!-However patient does not have medical  insurance I strongly recommend he establish care at the [facility] and to seek assistance for medical insurance   Obesity -recommend significant weight loss -recommend changing diet  Deconditioned state -Recommend increased daily activity and exercise   TOTAL TIME 11 mins   COVID-19 EDUCATION: The signs and symptoms of COVID-19 were discussed with the patient and how to seek care for testing.  The importance of social distancing was discussed today. Hand Washing Techniques and avoid touching face was advised.     CURRENT MEDICATIONS REVIEWED AT LENGTH WITH PATIENT TODAY   Patient satisfied with Plan of action and management. All questions answered  Follow up in 6 months    Khamiya Varin Santiago Glad, M.D.  [facility]  Medical Director [facility] Medical Director [facility] Cardio-Pulmonary Department

## 2019-12-03 ENCOUNTER — Ambulatory Visit: Payer: Medicaid Other | Attending: Neurology

## 2019-12-03 DIAGNOSIS — E119 Type 2 diabetes mellitus without complications: Secondary | ICD-10-CM | POA: Insufficient documentation

## 2020-03-12 IMAGING — CR DG CHEST 2V
3 series · 3 of 3 positions shown · non-contrast
Comparison: 09/08/2017

CLINICAL DATA: Chest pain

EXAM:
CHEST - 2 VIEW

[chest pa]
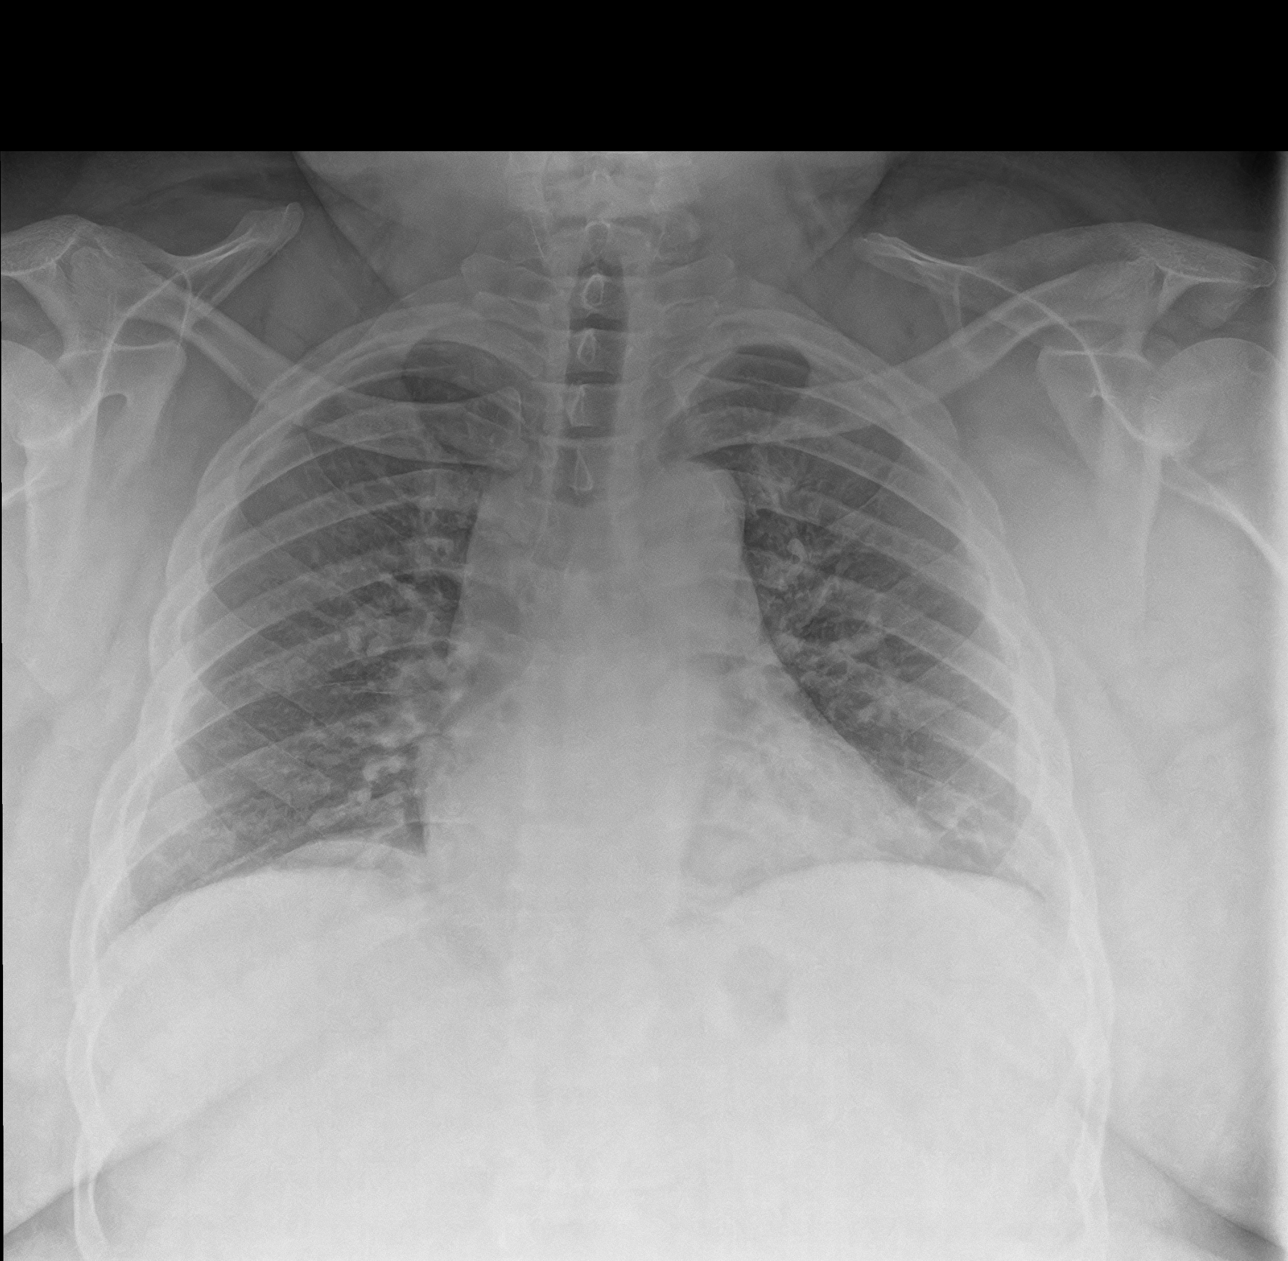

[chest lat (1 of 2)]
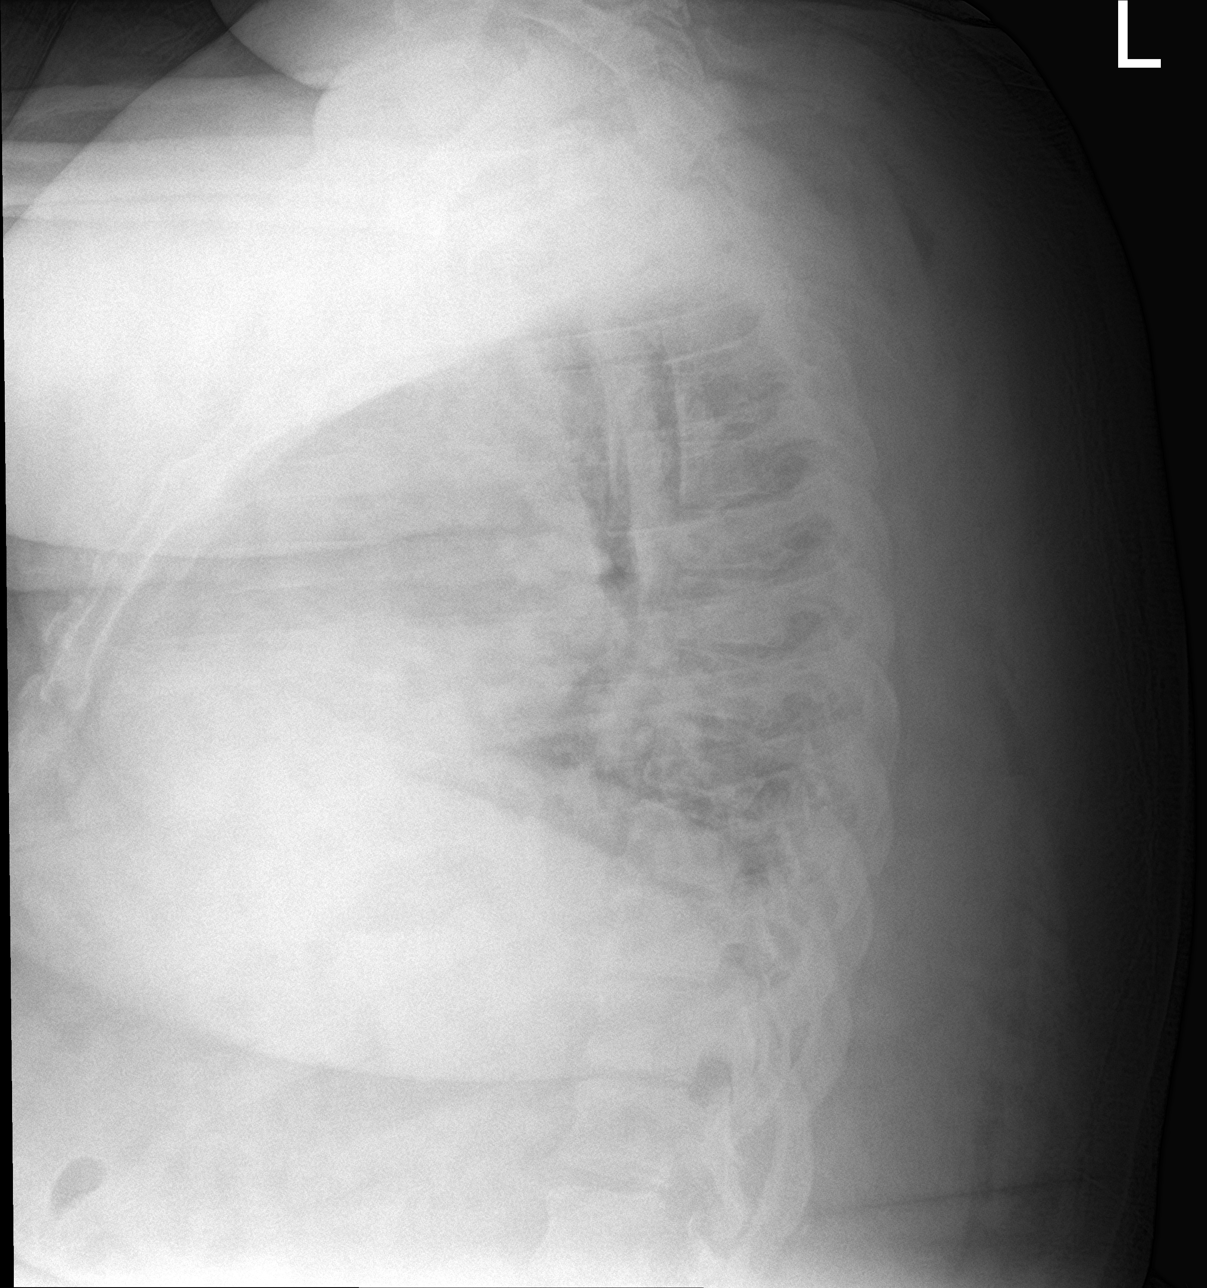

[chest lat (2 of 2)]
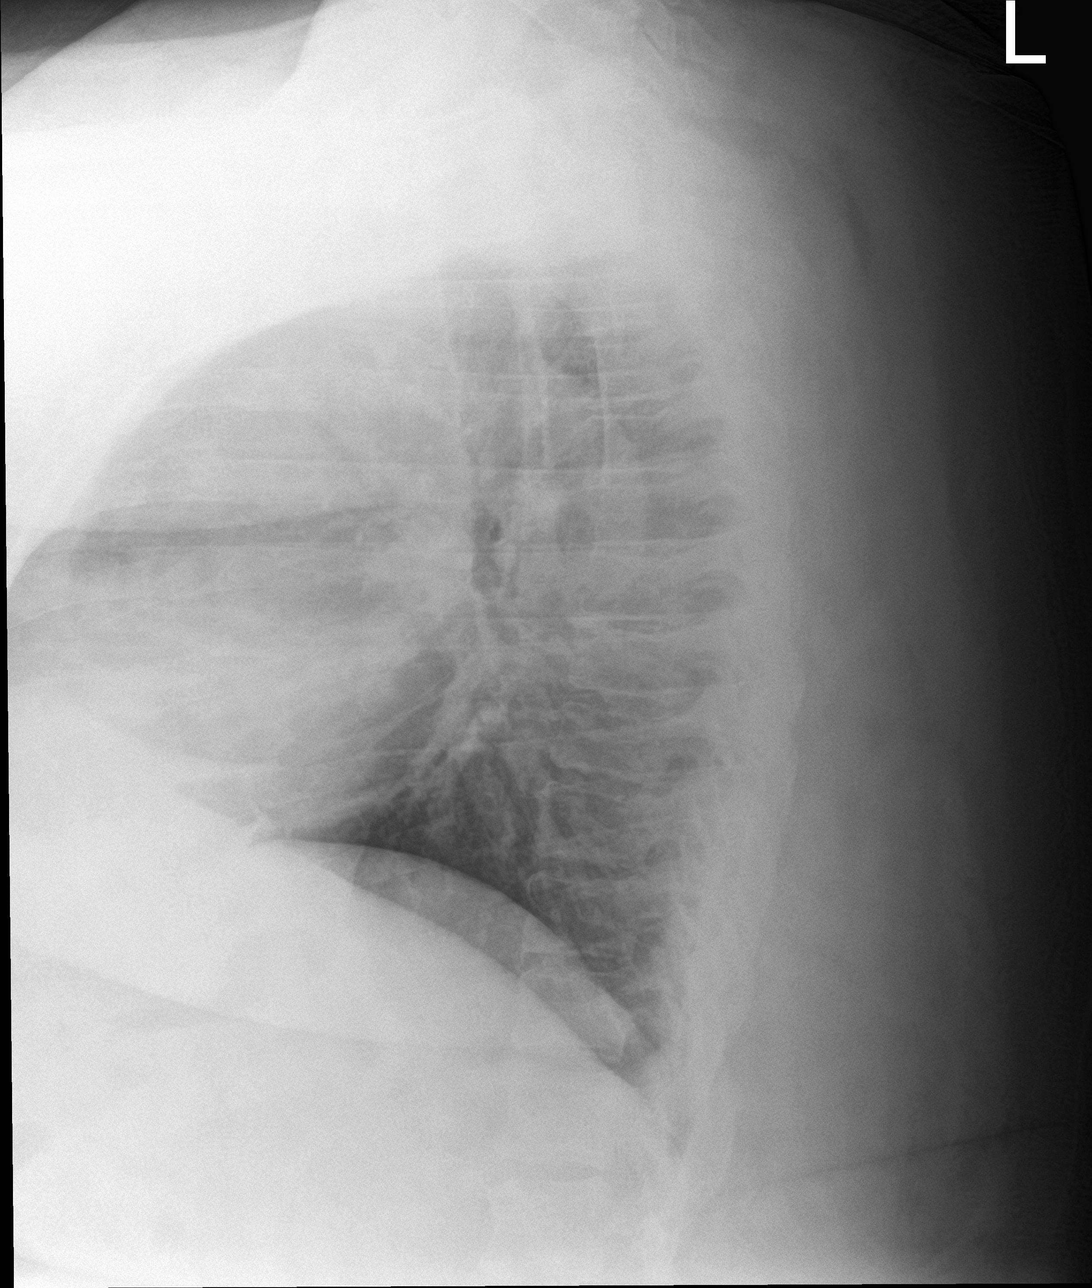

[3 of 3 positions shown; findings below may reference images not displayed]

FINDINGS: The heart size and mediastinal contours are within normal limits.
Both lungs are clear. The visualized skeletal structures are
unremarkable.
IMPRESSION: No acute abnormality of the lungs.

## 2021-10-06 ENCOUNTER — Inpatient Hospital Stay
Admission: EM | Admit: 2021-10-06 | Discharge: 2021-10-08 | DRG: 603 | Disposition: A | Discharge status: Home / Self Care | Payer: Self-pay | Attending: Internal Medicine | Admitting: Internal Medicine

## 2021-10-06 ENCOUNTER — Other Ambulatory Visit: Payer: Self-pay

## 2021-10-06 ENCOUNTER — Emergency Department: Payer: Self-pay

## 2021-10-06 DIAGNOSIS — Z87891 Personal history of nicotine dependence: Secondary | ICD-10-CM

## 2021-10-06 DIAGNOSIS — M542 Cervicalgia: Principal | ICD-10-CM

## 2021-10-06 DIAGNOSIS — F17200 Nicotine dependence, unspecified, uncomplicated: Secondary | ICD-10-CM

## 2021-10-06 DIAGNOSIS — I10 Essential (primary) hypertension: Secondary | ICD-10-CM

## 2021-10-06 DIAGNOSIS — L03221 Cellulitis of neck: Secondary | ICD-10-CM

## 2021-10-06 DIAGNOSIS — M609 Myositis, unspecified: Secondary | ICD-10-CM | POA: Diagnosis present

## 2021-10-06 DIAGNOSIS — K219 Gastro-esophageal reflux disease without esophagitis: Secondary | ICD-10-CM | POA: Diagnosis present

## 2021-10-06 DIAGNOSIS — Z79899 Other long term (current) drug therapy: Secondary | ICD-10-CM

## 2021-10-06 DIAGNOSIS — Z6841 Body Mass Index (BMI) 40.0 and over, adult: Secondary | ICD-10-CM

## 2021-10-06 DIAGNOSIS — L0211 Cutaneous abscess of neck: Principal | ICD-10-CM | POA: Diagnosis present

## 2021-10-06 DIAGNOSIS — Z8249 Family history of ischemic heart disease and other diseases of the circulatory system: Secondary | ICD-10-CM

## 2021-10-06 HISTORY — DX: Personal history of nicotine dependence: Z87.891

## 2021-10-06 HISTORY — DX: Gastro-esophageal reflux disease without esophagitis: K21.9

## 2021-10-06 LAB — HIV ANTIBODY (ROUTINE TESTING W REFLEX): HIV Screen 4th Generation wRfx: NONREACTIVE

## 2021-10-06 LAB — CBC WITH DIFFERENTIAL/PLATELET
Abs Immature Granulocytes: 0.04 10*3/uL (ref 0.00–0.07)
Basophils Absolute: 0.1 10*3/uL (ref 0.0–0.1)
Basophils Relative: 0 %
Eosinophils Absolute: 0.2 10*3/uL (ref 0.0–0.5)
Eosinophils Relative: 2 %
HCT: 43.4 % (ref 39.0–52.0)
Hemoglobin: 12.9 g/dL — ABNORMAL LOW (ref 13.0–17.0)
Immature Granulocytes: 0 %
Lymphocytes Relative: 28 %
Lymphs Abs: 3.3 10*3/uL (ref 0.7–4.0)
MCH: 24.9 pg — ABNORMAL LOW (ref 26.0–34.0)
MCHC: 29.7 g/dL — ABNORMAL LOW (ref 30.0–36.0)
MCV: 83.6 fL (ref 80.0–100.0)
Monocytes Absolute: 0.7 10*3/uL (ref 0.1–1.0)
Monocytes Relative: 6 %
Neutro Abs: 7.4 10*3/uL (ref 1.7–7.7)
Neutrophils Relative %: 64 %
Platelets: 292 10*3/uL (ref 150–400)
RBC: 5.19 MIL/uL (ref 4.22–5.81)
RDW: 14.6 % (ref 11.5–15.5)
WBC: 11.7 10*3/uL — ABNORMAL HIGH (ref 4.0–10.5)
nRBC: 0 % (ref 0.0–0.2)

## 2021-10-06 LAB — SEDIMENTATION RATE: Sed Rate: 26 mm/hr — ABNORMAL HIGH (ref 0–15)

## 2021-10-06 LAB — BASIC METABOLIC PANEL
Anion gap: 8 (ref 5–15)
BUN: 7 mg/dL (ref 6–20)
CO2: 29 mmol/L (ref 22–32)
Calcium: 9 mg/dL (ref 8.9–10.3)
Chloride: 102 mmol/L (ref 98–111)
Creatinine, Ser: 0.86 mg/dL (ref 0.61–1.24)
GFR, Estimated: >60 mL/min (ref >60)
Glucose, Bld: 159 mg/dL — ABNORMAL HIGH (ref 70–99)
Potassium: 4.2 mmol/L (ref 3.5–5.1)
Sodium: 139 mmol/L (ref 135–145)

## 2021-10-06 LAB — PROTIME-INR
INR: 1 (ref 0.8–1.2)
Prothrombin Time: 13.4 seconds (ref 11.4–15.2)

## 2021-10-06 LAB — APTT: aPTT: 31 seconds (ref 24–36)

## 2021-10-06 LAB — GLUCOSE, CAPILLARY: Glucose-Capillary: 121 mg/dL — ABNORMAL HIGH (ref 70–99)

## 2021-10-06 LAB — C-REACTIVE PROTEIN: CRP: 4.2 mg/dL — ABNORMAL HIGH (ref <1.0)

## 2021-10-06 MED ORDER — ONDANSETRON HCL 4 MG/2ML IJ SOLN: 4.0000 mg | Freq: Three times a day (TID) | INTRAMUSCULAR | Status: DC | PRN

## 2021-10-06 MED ORDER — OXYCODONE-ACETAMINOPHEN 5-325 MG PO TABS: 1.0000 | ORAL_TABLET | ORAL | Status: DC | PRN

## 2021-10-06 MED ORDER — LIDOCAINE-EPINEPHRINE (PF) 1 %-1:200000 IJ SOLN
10.0000 mL | Freq: Once | INTRAMUSCULAR | Status: AC
  Administered 2021-10-06: 10 mL
  Filled 2021-10-06: qty 10

## 2021-10-06 MED ORDER — VANCOMYCIN HCL IN DEXTROSE 1-5 GM/200ML-% IV SOLN
1000.0000 mg | Freq: Once | INTRAVENOUS | Status: DC
  Filled 2021-10-06: qty 200

## 2021-10-06 MED ORDER — FAMOTIDINE 20 MG PO TABS
20.0000 mg | ORAL_TABLET | Freq: Every day | ORAL | Status: DC
  Administered 2021-10-06 – 2021-10-08 (×3): 20 mg via ORAL
  Filled 2021-10-06 (×3): qty 1

## 2021-10-06 MED ORDER — HYDRALAZINE HCL 20 MG/ML IJ SOLN: 5.0000 mg | INTRAMUSCULAR | Status: DC | PRN

## 2021-10-06 MED ORDER — HEPARIN SODIUM (PORCINE) 5000 UNIT/ML IJ SOLN
5000.0000 [IU] | Freq: Three times a day (TID) | INTRAMUSCULAR | Status: DC
  Administered 2021-10-06 – 2021-10-08 (×6): 5000 [IU] via SUBCUTANEOUS
  Filled 2021-10-06 (×6): qty 1

## 2021-10-06 MED ORDER — MORPHINE SULFATE (PF) 2 MG/ML IV SOLN: 2.0000 mg | INTRAVENOUS | Status: DC | PRN

## 2021-10-06 MED ORDER — VANCOMYCIN HCL IN DEXTROSE 1-5 GM/200ML-% IV SOLN
1000.0000 mg | Freq: Once | INTRAVENOUS | Status: AC
  Administered 2021-10-06: 1000 mg via INTRAVENOUS

## 2021-10-06 MED ORDER — ONDANSETRON HCL 4 MG/2ML IJ SOLN
4.0000 mg | Freq: Once | INTRAMUSCULAR | Status: AC
Start: 2021-10-06 — End: 2021-10-06
  Administered 2021-10-06: 4 mg via INTRAVENOUS
  Filled 2021-10-06: qty 2

## 2021-10-06 MED ORDER — VANCOMYCIN HCL 1750 MG/350ML IV SOLN
1750.0000 mg | Freq: Two times a day (BID) | INTRAVENOUS | Status: DC
  Administered 2021-10-06 – 2021-10-08 (×4): 1750 mg via INTRAVENOUS
  Filled 2021-10-06 (×4): qty 350

## 2021-10-06 MED ORDER — SODIUM CHLORIDE 0.9 % IV SOLN: INTRAVENOUS | Status: DC

## 2021-10-06 MED ORDER — ACETAMINOPHEN 325 MG PO TABS
650.0000 mg | ORAL_TABLET | Freq: Four times a day (QID) | ORAL | Status: DC | PRN
  Administered 2021-10-07: 650 mg via ORAL
  Filled 2021-10-06: qty 2

## 2021-10-06 MED ORDER — SODIUM CHLORIDE 0.9 % IV SOLN
2.0000 g | Freq: Once | INTRAVENOUS | Status: AC
  Administered 2021-10-06: 2 g via INTRAVENOUS
  Filled 2021-10-06: qty 20

## 2021-10-06 MED ORDER — SODIUM CHLORIDE 0.9 % IV SOLN
2.0000 g | INTRAVENOUS | Status: DC
  Administered 2021-10-07 – 2021-10-08 (×2): 2 g via INTRAVENOUS
  Filled 2021-10-06 (×2): qty 2

## 2021-10-06 MED ORDER — SODIUM CHLORIDE 0.9 % IV SOLN
INTRAVENOUS | Status: AC
Start: 2021-10-06 — End: 2021-10-06

## 2021-10-06 MED ORDER — IOHEXOL 300 MG/ML  SOLN
75.0000 mL | Freq: Once | INTRAMUSCULAR | Status: AC | PRN
  Administered 2021-10-06: 75 mL via INTRAVENOUS

## 2021-10-06 MED ORDER — VANCOMYCIN HCL 1500 MG/300ML IV SOLN
1500.0000 mg | Freq: Once | INTRAVENOUS | Status: AC
  Administered 2021-10-06: 1500 mg via INTRAVENOUS
  Filled 2021-10-06: qty 300

## 2021-10-06 NOTE — ED Provider Notes (Signed)
[facility] Provider Note    Event Date/Time   First MD Initiated Contact with Patient 10/06/21 [phone]     (approximate)   History   Chief Complaint Neck Pain   HPI [name] is a 46 y.o. male, history of morbid obesity, GERD, hypertension, tobacco use, presents the emergency department for evaluation of left-sided neck pain.  Patient states that this been going on for the past 3 days, reports sharp pain with light palpation along the left side of his neck extending towards the lateral and anterior aspect of his neck as well.  He states that the pain hurts so bad that it is causing his vision to blur at times.  Denies any recent illnesses or injuries.  Denies fever/chills, chest pain, shortness of breath, abdominal pain, flank pain, nausea/vomiting, diarrhea, urinary symptoms,  History Limitations: No limitations.        Physical Exam  Triage Vital Signs: ED Triage Vitals  Enc Vitals Group     BP 10/06/21 0847 135/81     Pulse Rate 10/06/21 0847 85     Resp 10/06/21 0847 18     Temp 10/06/21 0847 98.4 F (36.9 C)     Temp Source 10/06/21 0847 Oral     SpO2 10/06/21 0847 94 %     Weight 10/06/21 0846 (!) 366 lb (166 kg)     Height 10/06/21 0848 5' 4.5" (1.638 m)     Head Circumference --      Peak Flow --      Pain Score 10/06/21 0846 8     Pain Loc --      Pain Edu? --      Excl. in GC? --     Most recent vital signs: Vitals:   10/06/21 1210 10/06/21 1228  BP:  136/76  Pulse:  78  Resp: 20 18  Temp: 98.1 F (36.7 C) 98.5 F (36.9 C)  SpO2:  96%    General: Awake, NAD.  Skin: Warm, dry. No rashes or lesions.  Eyes: PERRL. Conjunctivae normal.  CV: Good peripheral perfusion.  Resp: Normal effort.  Abd: Soft, non-tender. No distention.  Neuro: At baseline. No gross neurological deficits.   Focused Exam: Exquisite tenderness when palpating the paraspinal muscles of the neck, predominantly on the left side.  Tenderness  extends down to the lateral and anterior of his neck as well.  Mild swelling.  No noticeable erythema or warmth.  Dental and dental exam unremarkable.  No tonsillar swelling or exudates.  Uvula midline.  Physical Exam    ED Results / Procedures / Treatments  Labs (all labs ordered are listed, but only abnormal results are displayed) Labs Reviewed  BASIC METABOLIC PANEL - Abnormal; Notable for the following components:      Result Value   Glucose, Bld 159 (*)    All other components within normal limits  CBC WITH DIFFERENTIAL/PLATELET - Abnormal; Notable for the following components:   WBC 11.7 (*)    Hemoglobin 12.9 (*)    MCH 24.9 (*)    MCHC 29.7 (*)    All other components within normal limits  SEDIMENTATION RATE - Abnormal; Notable for the following components:   Sed Rate 26 (*)    All other components within normal limits  CULTURE, BLOOD (ROUTINE X 2)  CULTURE, BLOOD (ROUTINE X 2)  AEROBIC/ANAEROBIC CULTURE W GRAM STAIN (SURGICAL/DEEP WOUND)  PROTIME-INR  APTT  C-REACTIVE PROTEIN  HIV ANTIBODY (ROUTINE TESTING W REFLEX)  EKG NA   RADIOLOGY  ED Provider Interpretation: I personally viewed and interpreted this CT scan.  Abscess appreciated in the posterior lateral aspect of the neck.  CT Soft Tissue Neck W Contrast  Result Date: 10/06/2021 CLINICAL DATA:  46 year old male with left side neck pain and swelling starting 2 days ago. EXAM: CT NECK WITH CONTRAST TECHNIQUE: Multidetector CT imaging of the neck was performed using the standard protocol following the bolus administration of intravenous contrast. RADIATION DOSE REDUCTION: This exam was performed according to the departmental dose-optimization program which includes automated exposure control, adjustment of the mA and/or kV according to patient size and/or use of iterative reconstruction technique. CONTRAST:  80mL OMNIPAQUE IOHEXOL 300 MG/ML  SOLN COMPARISON:  Cervical spine CT 11/13/2016. FINDINGS: Pharynx  and larynx: Negative. Salivary glands: Negative. Thyroid: Negative. Lymph nodes: In general the bilateral cervical lymph nodes are diminutive and within normal limits. Left posterolateral neck there is an indistinct roughly 9 cm area of subcutaneous inflammatory stranding underlying chronic skin thickening (series 6, image 140). Inflammation involves the underlying superficial trapezius muscle, and at the junction of the subcutaneous fat and muscle there is an oval 15 x 15 x 21 mm low-density fluid collection. Inflammation also involves the left suprascapular muscle. See series 2, image 45, coronal image 140. Nearby asymmetrically enlarged left level IIIb lymph node is 11 mm short axis and appears reactive. No soft tissue gas. Superimposed right suboccipital area of chronic scalp soft tissue scarring on series 2, image 28 is unchanged since 2018. Vascular: Suboptimal intravascular contrast bolus but the major vascular structures in the neck and at the skull base appear to be patent. There is venous contrast reflux superficially in the upper left chest. Limited intracranial: Negative. Visualized orbits: Negative. Mastoids and visualized paranasal sinuses: Chronic maxillary sinus mucoperiosteal thickening. Other paranasal sinuses are well aerated. Tympanic cavities and mastoids are clear. Skeleton: Carious left maxillary posterior bicuspid, right mandible anterior bicuspid. No acute osseous abnormality identified. Upper chest: Negative visible superior mediastinum aside from some lipomatosis. Visible major airways are patent through the carina. Mild upper lung atelectasis. IMPRESSION: 1. Evidence of Cellulitis and superficial Myositis in the left posterolateral neck with a 2.1 cm Abscess at the junction of the subcutaneous fat and muscle (sagittal image 93). Nearby reactive appearing left level IIIb lymph node. 2. No other complicating features. No other acute finding in the Neck; carious left maxillary and right  mandible anterior bicuspids. Electronically Signed   By: Odessa Fleming M.D.   On: 10/06/2021 10:17    PROCEDURES:  Critical Care performed: N/A.  Procedures    MEDICATIONS ORDERED IN ED: Medications  vancomycin (VANCOREADY) IVPB 1500 mg/300 mL (1,500 mg Intravenous New Bag/Given 10/06/21 1413)    Followed by  vancomycin (VANCOCIN) IVPB 1000 mg/200 mL premix (0 mg Intravenous Stopped 10/06/21 1220)  morphine (PF) 2 MG/ML injection 2 mg (has no administration in time range)  oxyCODONE-acetaminophen (PERCOCET/ROXICET) 5-325 MG per tablet 1 tablet (has no administration in time range)  acetaminophen (TYLENOL) tablet 650 mg (has no administration in time range)  ondansetron (ZOFRAN) injection 4 mg (has no administration in time range)  hydrALAZINE (APRESOLINE) injection 5 mg (has no administration in time range)  heparin injection 5,000 Units (5,000 Units Subcutaneous Given 10/06/21 1404)  cefTRIAXone (ROCEPHIN) 2 g in sodium chloride 0.9 % 100 mL IVPB (has no administration in time range)  famotidine (PEPCID) tablet 20 mg (20 mg Oral Given 10/06/21 1404)  vancomycin (VANCOREADY) IVPB 1750 mg/350 mL (has  no administration in time range)  0.9 %  sodium chloride infusion ( Intravenous New Bag/Given 10/06/21 1411)  iohexol (OMNIPAQUE) 300 MG/ML solution 75 mL (75 mLs Intravenous Contrast Given 10/06/21 1003)  ondansetron (ZOFRAN) injection 4 mg (4 mg Intravenous Given 10/06/21 1017)  cefTRIAXone (ROCEPHIN) 2 g in sodium chloride 0.9 % 100 mL IVPB (0 g Intravenous Stopped 10/06/21 1115)  lidocaine-EPINEPHrine (PF) (XYLOCAINE-EPINEPHrine) 1 %-1:200000 (PF) injection 10 mL (10 mLs Infiltration Given by Other 10/06/21 1359)     IMPRESSION / MDM / ASSESSMENT AND PLAN / ED COURSE  I reviewed the triage vital signs and the nursing notes.                              Differential diagnosis includes, but is not limited to, paraspinal cervical strain, peritonsillar abscess, cellulitis, retropharyngeal  abscess.  ED Course Patient appears well, vitals within normal limits.  NAD.  Offered pain medication, however patient refused.  CBC shows leukocytosis at 11.7.  No clinically significant anemia present.  BMP unremarkable for electrolyte abnormalities or kidney injury.  It does however show elevated glucose at 159.  Assessment/Plan Patient presented with diffuse neck pain x3 days.  Found to have cellulitis and superficial myositis in the left posterior lateral neck with a 2.1 cm abscess at the junction of subcutaneous fat and muscle.  Spoke with the on-call ENT specialist, Dr. Willeen Cass, who advised that he would review the CT imaging and determine the viability for potential drainage.  In the meantime, recommended admission with IV antibiotics.  Initiated ceftriaxone and vancomycin.  Patient states his pain is currently controlled at this time.  We will plan to admit to the hospital service.  Spoke to the on-call hospitalist, who agreed to admission.      FINAL CLINICAL IMPRESSION(S) / ED DIAGNOSES   Final diagnoses:  None     Rx / DC Orders   ED Discharge Orders     None        Note:  This document was prepared using Dragon voice recognition software and may include unintentional dictation errors.   Varney Daily, [place] 10/06/21 1551    Concha Se, MD 10/07/21 [phone]

## 2021-10-06 NOTE — Progress Notes (Signed)
Patient with emesis after CT; emesis bag provided; MD aware. ?

## 2021-10-06 NOTE — Assessment & Plan Note (Signed)
?  BMI= 61.85  and BW= 166 ?-Diet and exercise.   ?-Encouraged to lose weight. ? ?

## 2021-10-06 NOTE — ED Notes (Signed)
Lactic sent on ice to lab with 2 sets of cultures. Pt offered pain meds again but pt declined offer. ?

## 2021-10-06 NOTE — ED Notes (Signed)
Informed RN bed assigned 

## 2021-10-06 NOTE — H&P (Signed)
?History and Physical  ? ? ?Adarius HillsJermaine Hundertmark ZOX:[id number] DOB: 12/31/1975 DOA: 10/06/2021 ? ?Referring MD/NP/PA:  ? ?[facility]  ? ?Patient coming from:  The patient is coming from home.  At baseline, pt is independent for most of ADL.       ? ?Chief Complaint: neck pain ? ?HPI: Anthony Angelica PouJermaine Valenzuela is a 46 y.o. male with medical history significant of GERD, morbid obesity with BMI 61.85,  form smoker, who presents with neck pain. ? ?Patient states that he has neck pain for 3 days.  It is located in the left side of neck, constant, sharp, severe, extending towards the lateral and anterior aspect of his neck. Patient denies fever or chills.  Patient has mild dry cough, mild shortness breath.  Denies nausea, vomiting, diarrhea or abdominal pain. No symptoms of UTI.  No injury to the neck. He states that the pain hurts so bad that it is causing his vision to blur at times.  No unilateral numbness or tinglings in extremities. no facial droop or slurred speech. ? ?Of note, hypertension is on his medical problem list, but patient denies history of hypertension.  He is not taking medications for hypertension. ? ? ?Data Reviewed and ED Course: pt was found to have WBC 11.7, electrolytes and renal function okay, temperature normal, blood pressure 120/82, heart rate 85, 101, RR 19, oxygen saturation 95% on room air.  Patient is admitted to MedSurg bed as inpatient.  Dr. Willeen CassBennett of ENT is consulted. ? ?CT of neck soft tissue: ?1. Evidence of Cellulitis and superficial Myositis in the left ?posterolateral neck with a 2.1 cm Abscess at the junction of the ?subcutaneous fat and muscle (sagittal image 93). Nearby reactive ?appearing left level IIIb lymph node. ?  ?2. No other complicating features. No other acute finding in the ?Neck; carious left maxillary and right mandible anterior bicuspids. ? ? ?EKG: I have personally reviewed.  Sinus rhythm, QTc 433, right axis  deviation. ? ? ?Review of Systems:  ? ?General: no fevers, chills, no body weight gain, has fatigue ?HEENT: no blurry vision, hearing changes or sore throat. Has neck pain ?Respiratory:  has mild dyspnea, coughing, no wheezing ?CV: no chest pain, no palpitations ?GI: no nausea, vomiting, abdominal pain, diarrhea, constipation ?GU: no dysuria, burning on urination, increased urinary frequency, hematuria  ?Ext: no leg edema ?Neuro: no unilateral weakness, numbness, or tingling, no hearing loss ?Skin: no rash, no skin tear. ?MSK: No muscle spasm, no deformity, no limitation of range of movement in spin ?Heme: No easy bruising.  ?Travel history: No recent long distant travel. ? ? ?Allergy: No Known Allergies ? ?Past Medical History:  ?Diagnosis Date  ? Former smoker   ? GERD (gastroesophageal reflux disease)   ? ? ?History reviewed. No pertinent surgical history. ? ?Social History:  reports that he quit smoking about 2 years ago. His smoking use included cigarettes. He started smoking about 32 years ago. He has a 36.25 pack-year smoking history. He has never used smokeless tobacco. He reports current alcohol use. He reports that he does not use drugs. ? ?Family History:  ?Family History  ?Problem Relation Age of Onset  ? Heart attack Mother   ? Heart disease Mother   ? Hypertension Mother   ?  ? ?Prior to Admission medications   ?Medication Sig Start Date End Date Taking? Authorizing Provider  ?famotidine (PEPCID) 20 MG tablet Take 1 tablet (20 mg total) by mouth daily. 07/15/19 07/14/20  Phineas Semen, MD  ?sucralfate (CARAFATE) 1 g tablet Take 1 tablet (1 g total) by mouth 4 (four) times daily. 07/15/19   [facility]ine[facility] Semen, MD  ? ? ?Physical Exam: ?Vitals:  ? 10/06/21 1130 10/06/21 1200 10/06/21 1210 10/06/21 1228  ?BP: 118/77   136/76  ?Pulse: 77 77  78  ?Resp:   20 18  ?Temp:   98.1 ?F (36.7 ?C) 98.5 ?F (36.9 ?C)  ?TempSrc:   Oral Oral  ?SpO2:  97%  96%  ?Weight:      ?Height:      ? ?General: Not in acute  distress ?HEENT: has tenderness and warmth in left side of neck. No fluctuation. ?      Eyes: PERRL, EOMI, no scleral icterus. ?      ENT: No discharge from the ears and nose, no pharynx injection, no tonsillar enlargement.  ?      Neck: No JVD, no bruit, no mass felt. ?Heme: No neck lymph node enlargement. ?Cardiac: S1/S2, RRR, No murmurs, No gallops or rubs. ?Respiratory: No rales, wheezing, rhonchi or rubs. ?GI: Soft, nondistended, nontender, no rebound pain, no organomegaly, BS present. ?GU: No hematuria ?Ext: No pitting leg edema bilaterally. 1+DP/PT pulse bilaterally. ?Musculoskeletal: No joint deformities, No joint redness or warmth, no limitation of ROM in spin. ?Skin: No rashes.  ?Neuro: Alert, oriented X3, cranial nerves II-XII grossly intact, moves all extremities normally.  ?Psych: Patient is not psychotic, no suicidal or hemocidal ideation. ? ?Labs on Admission: I have personally reviewed following labs and imaging studies ? ?CBC: ?Recent Labs  ?Lab 10/06/21 ?0922  ?WBC 11.7*  ?NEUTROABS 7.4  ?HGB 12.9*  ?HCT 43.4  ?MCV 83.6  ?PLT 292  ? ?Basic Metabolic Panel: ?Recent Labs  ?Lab 10/06/21 ?0922  ?NA 139  ?K 4.2  ?CL 102  ?CO2 29  ?GLUCOSE 159*  ?BUN 7  ?CREATININE 0.86  ?CALCIUM 9.0  ? ?GFR: ?Estimated Creatinine Clearance: 152.4 mL/min (by C-G formula based on SCr of 0.86 mg/dL). ?Liver Function Tests: ?No results for input(s): AST, ALT, ALKPHOS, BILITOT, PROT, ALBUMIN in the last 168 hours. ?No results for input(s): LIPASE, AMYLASE in the last 168 hours. ?No results for input(s): AMMONIA in the last 168 hours. ?Coagulation Profile: ?Recent Labs  ?Lab 10/06/21 ?1244  ?INR 1.0  ? ?Cardiac Enzymes: ?No results for input(s): CKTOTAL, CKMB, CKMBINDEX, TROPONINI in the last 168 hours. ?BNP (last 3 results) ?No results for input(s): PROBNP in the last 8760 hours. ?HbA1C: ?No results for input(s): HGBA1C in the last 72 hours. ?CBG: ?No results for input(s): GLUCAP in the last 168 hours. ?Lipid Profile: ?No  results for input(s): CHOL, HDL, LDLCALC, TRIG, CHOLHDL, LDLDIRECT in the last 72 hours. ?Thyroid Function Tests: ?No results for input(s): TSH, T4TOTAL, FREET4, T3FREE, THYROIDAB in the last 72 hours. ?Anemia Panel: ?No results for input(s): VITAMINB12, FOLATE, FERRITIN, TIBC, IRON, RETICCTPCT in the last 72 hours. ?Urine analysis: ?No results found for: COLORURINE, APPEARANCEUR, LABSPEC, PHURINE, GLUCOSEU, HGBUR, BILIRUBINUR, KETONESUR, PROTEINUR, UROBILINOGEN, NITRITE, LEUKOCYTESUR ?Sepsis Labs: ?@[email or web address] (procalcitonin:4,lacticidven:4) ?)No results found for this or any previous visit (from the past 240 hour(s)).  ? ?Radiological Exams on Admission: ?CT Soft Tissue Neck W Contrast ? ?Result Date: 10/06/2021 ?CLINICAL DATA:  46 year old male with left side neck pain and swelling starting 2 days ago. EXAM: CT NECK WITH CONTRAST TECHNIQUE: Multidetector CT imaging of the neck was performed using the standard protocol following the bolus administration of intravenous contrast. RADIATION DOSE REDUCTION: This exam was performed according to the  departmental dose-optimization program which includes automated exposure control, adjustment of the mA and/or kV according to patient size and/or use of iterative reconstruction technique. CONTRAST:  45mL OMNIPAQUE IOHEXOL 300 MG/ML  SOLN COMPARISON:  Cervical spine CT 11/13/2016. FINDINGS: Pharynx and larynx: Negative. Salivary glands: Negative. Thyroid: Negative. Lymph nodes: In general the bilateral cervical lymph nodes are diminutive and within normal limits. Left posterolateral neck there is an indistinct roughly 9 cm area of subcutaneous inflammatory stranding underlying chronic skin thickening (series 6, image 140). Inflammation involves the underlying superficial trapezius muscle, and at the junction of the subcutaneous fat and muscle there is an oval 15 x 15 x 21 mm low-density fluid collection. Inflammation also involves the left suprascapular muscle. See series 2,  image 45, coronal image 140. Nearby asymmetrically enlarged left level IIIb lymph node is 11 mm short axis and appears reactive. No soft tissue gas. Superimposed right suboccipital area of chronic scalp soft tissue scarring on series

## 2021-10-06 NOTE — ED Notes (Signed)
Verified that provider BS not ordering blood cultures before this RN hangs antibiotics.  ?

## 2021-10-06 NOTE — ED Notes (Signed)
EKG to EDP Robinson in person.  

## 2021-10-06 NOTE — ED Notes (Signed)
Will hang bag of vanc from pharm once received.  ?

## 2021-10-06 NOTE — ED Notes (Signed)
Vanc to be hung once rocephin finished. Pt given drink with verbal okay from provider BS.  ?

## 2021-10-06 NOTE — Consult Note (Signed)
PHARMACY -  BRIEF ANTIBIOTIC NOTE  ? ?Pharmacy has received consult(s) for Vacnomcyin from an ED provider.  The patient's profile has been reviewed for ht/wt/allergies/indication/available labs.   ? ?One time order(s) placed for Vancomycin 1.5gm IV x1 followed by Vancomycin 1gm IV x1 (total 2.5gm load) ? ?Further antibiotics/pharmacy consults should be ordered by admitting physician if indicated.       ?                ?Thank you, ?Selinda Eon ?10/06/2021  10:57 AM ? ?

## 2021-10-06 NOTE — ED Triage Notes (Signed)
Pt comes pov with left sided neck pain for 3 days. States the pain is making his eyes hurt as well.  ?

## 2021-10-06 NOTE — Assessment & Plan Note (Signed)
CT scan showed evidence of Cellulitis and superficial Myositis in the left posterolateral neck with a 2.1 cm Abscess at the junction of the subcutaneous fat and muscle..  Patient has not mild leukocytosis with WBC 11.7, no fever. Does not meet criteria for sepsis. Consulted Dr. Richardson Landry of ENT. ? ?- Admitted to [place] bed as inpatient ?- Empiric antimicrobial treatment with vancomycin and Rocephin ?- PRN Zofran for nausea, morphine and Percocet for pain ?- Blood cultures x 2  ?- ESR and CRP ?- IVF: 100 ml/h of NS ? ?

## 2021-10-06 NOTE — Assessment & Plan Note (Signed)
pepcid

## 2021-10-06 NOTE — Plan of Care (Signed)

## 2021-10-06 NOTE — Consult Note (Signed)
Pharmacy Antibiotic Note ? ?[name] is a 46 y.o. male admitted on 10/06/2021 with cellulitis.  Pharmacy has been consulted for Vancomycin dosing. ? ?Plan: ?Give Vancomycin 2500mg  IV x1 followed by Vancomycin 1750mg  IV every 12 hours ?Goal AUC 400-550  ?Est AUC: 535 ?Est Cmax: 34.5 ?Est Cmin: 15.7 ?Calculated with SCr 0.86 ? ? ?Height: 5' 4.5" (163.8 cm) ?Weight: (!) 166 kg (365 lb 15.4 oz) ?IBW/kg (Calculated) : 60.35 ? ?Temp (24hrs), Avg:98.3 ?F (36.8 ?C), Min:98.1 ?F (36.7 ?C), Max:98.4 ?F (36.9 ?C) ? ?Recent Labs  ?Lab 10/06/21 ?0922  ?WBC 11.7*  ?CREATININE 0.86  ?  ?Estimated Creatinine Clearance: 152.4 mL/min (by C-G formula based on SCr of 0.86 mg/dL).   ? ?No Known Allergies ? ?Antimicrobials this admission: ?5/17 Vancomycin >>> ?5/17 Ceftriaxone >>> ? ?Dose adjustments this admission: ? ? ?Microbiology results: ?5/17 BCx: sent ? ?Thank you for allowing pharmacy to be a part of this patient?s care. ? ?6/17 ?10/06/2021 12:17 PM ? ?

## 2021-10-06 NOTE — ED Notes (Signed)
See triage note  presents with pain to left side of neck  states pain started 2 days ago  denies any injury   swelling noted to posterior neck and into left lateral neck  area is tender to touch   ?

## 2021-10-06 NOTE — Consult Note (Signed)
[name], [name] ?[id number] ?01/17/1976 ?Anthony Nearing, MD ? ?Reason for Consult: Neck abscess ?Requesting Physician: Ivor Costa, MD ?Consulting Physician: Anthony Nearing, MD ? ?HPI: This 46 y.o. year old male was admitted on 10/06/2021 for Cellulitis and abscess of neck [L03.221, L02.11].  Patient has had worsening left-sided neck pain and presented to the emergency room.  A CT scan showed a subcutaneous abscess with associated cellulitis.  He has not been running any fevers with this. ? ?Allergies: No Known Allergies ? ?Medications:  ?Medications Prior to Admission  ?Medication Sig Dispense Refill  ? famotidine (PEPCID) 20 MG tablet Take 1 tablet (20 mg total) by mouth daily. 30 tablet 1  ? sucralfate (CARAFATE) 1 g tablet Take 1 tablet (1 g total) by mouth 4 (four) times daily. (Patient not taking: Reported on 10/06/2021) 60 tablet 0  ?.  ?Current Facility-Administered Medications  ?Medication Dose Route Frequency Provider Last Rate Last Admin  ? 0.9 %  sodium chloride infusion   Intravenous Continuous Ivor Costa, MD      ? acetaminophen (TYLENOL) tablet 650 mg  650 mg Oral Q6H PRN Ivor Costa, MD      ? Derrill Memo ON 10/07/2021] cefTRIAXone (ROCEPHIN) 2 g in sodium chloride 0.9 % 100 mL IVPB  2 g Intravenous Q24H Ivor Costa, MD      ? famotidine (PEPCID) tablet 20 mg  20 mg Oral Daily Ivor Costa, MD      ? heparin injection 5,000 Units  5,000 Units Subcutaneous Q8H Ivor Costa, MD      ? hydrALAZINE (APRESOLINE) injection 5 mg  5 mg Intravenous Q2H PRN Ivor Costa, MD      ? lidocaine-EPINEPHrine (PF) (XYLOCAINE-EPINEPHrine) 1 %-1:200000 (PF) injection 10 mL  10 mL Infiltration Once Teodoro Spray, PA      ? morphine (PF) 2 MG/ML injection 2 mg  2 mg Intravenous Q3H PRN Ivor Costa, MD      ? ondansetron (ZOFRAN) injection 4 mg  4 mg Intravenous Q8H PRN Ivor Costa, MD      ? oxyCODONE-acetaminophen (PERCOCET/ROXICET) 5-325 MG per tablet 1 tablet  1 tablet Oral Q4H PRN Ivor Costa, MD      ? vancomycin (VANCOREADY)  IVPB 1500 mg/300 mL  1,500 mg Intravenous Once Darrick Penna, RPH      ? vancomycin (VANCOREADY) IVPB 1750 mg/350 mL  1,750 mg Intravenous Q12H Darrick Penna, RPH      ? ? ?PMH:  ?Past Medical History:  ?Diagnosis Date  ? Former smoker   ? GERD (gastroesophageal reflux disease)   ? ? ?Fam Hx:  ?Family History  ?Problem Relation Age of Onset  ? Heart attack Mother   ? Heart disease Mother   ? Hypertension Mother   ? ? ?Soc Hx:  ?Social History  ? ?Socioeconomic History  ? Marital status: Single  ?  Spouse name: Not on file  ? Number of children: Not on file  ? Years of education: Not on file  ? Highest education level: Not on file  ?Occupational History  ? Not on file  ?Tobacco Use  ? Smoking status: Former  ?  Packs/day: 1.25  ?  Years: 29.00  ?  Pack years: 36.25  ?  Types: Cigarettes  ?  Start date: 31  ?  Quit date: 04/14/2019  ?  Years since quitting: 2.4  ? Smokeless tobacco: Never  ?Vaping Use  ? Vaping Use: Never used  ?Substance and Sexual Activity  ? Alcohol use: Yes  ?  Comment: rarely  ? Drug use: Never  ? Sexual activity: Not on file  ?Other Topics Concern  ? Not on file  ?Social History Narrative  ? Not on file  ? ?Social Determinants of Health  ? ?Financial Resource Strain: Not on file  ?Food Insecurity: Not on file  ?Transportation Needs: Not on file  ?Physical Activity: Not on file  ?Stress: Not on file  ?Social Connections: Not on file  ?Intimate Partner Violence: Not on file  ? ? ?PSH: History reviewed. No pertinent surgical history.. Procedures since admission: No admission procedures for hospital encounter. ? ?ROS: Review of systems normal other than 12 systems except per HPI. ? ?PHYSICAL EXAM ?Vitals:  ?Vitals:  ? 10/06/21 1210 10/06/21 1228  ?BP:  136/76  ?Pulse:  78  ?Resp: 20 18  ?Temp: 98.1 ?F (36.7 ?C) 98.5 ?F (36.9 ?C)  ?SpO2:  96%  ?Marland Kitchen  ?General: Well-developed, Obese Well-nourished in no acute distress ?Mood: Mood and affect well adjusted, pleasant and cooperative. ?Orientation:  Grossly alert and oriented. ?Vocal Quality: No hoarseness. Communicates verbally. ?head and Face: NCAT. No facial asymmetry. No visible skin lesions. No significant facial scars. No tenderness with sinus percussion. Facial strength normal and symmetric. ?Ears: External ears with normal landmarks, no lesions.  ?Hearing: Speech reception grossly normal. ?Nose: External nose normal with midline dorsum and no lesions or deformity. Nasal Cavity reveals essentially midline septum with normal inferior turbinates. No significant mucosal congestion or erythema. Nasal secretions are minimal and clear. No polyps seen on anterior rhinoscopy. ?Oral Cavity/ Oropharynx: Lips are normal with no lesions. Teeth no frank dental caries. Gingiva healthy with no lesions or gingivitis.  Posterior pharynx is unremarkable.Marland Kitchen ?Indirect Laryngoscopy/Nasopharyngoscopy: Visualization of the larynx, hypopharynx and nasopharynx is not possible in this setting with routine examination. ?Neck: There is tenderness and induration in the left posterior neck centered around an intertriginous fold in the skin of the neck.  No fluctuance is actually palpable here but it is quite firm. ?Lymphatic: No palpable lymphadenopathy. ?Respiratory: Normal respiratory effort without labored breathing. ?Cardiovascular: Carotid pulse shows regular rate and rhythm ?Neurologic: Cranial Nerves II through XII are grossly intact. ?Eyes: Gaze and Ocular Motility are grossly normal. PERRLA. No visible nystagmus. ? ?MEDICAL DECISION MAKING: ?Data Review:  ?Results for orders placed or performed during the hospital encounter of 10/06/21 (from the past 48 hour(s))  ?Basic metabolic panel     Status: Abnormal  ? Collection Time: 10/06/21  9:22 AM  ?Result Value Ref Range  ? Sodium 139 135 - 145 mmol/L  ? Potassium 4.2 3.5 - 5.1 mmol/L  ? Chloride 102 98 - 111 mmol/L  ? CO2 29 22 - 32 mmol/L  ? Glucose, Bld 159 (H) 70 - 99 mg/dL  ?  Comment: Glucose reference range applies only  to samples taken after fasting for at least 8 hours.  ? BUN 7 6 - 20 mg/dL  ? Creatinine, Ser 0.86 0.61 - 1.24 mg/dL  ? Calcium 9.0 8.9 - 10.3 mg/dL  ? GFR, Estimated >60 >60 mL/min  ?  Comment: (NOTE) ?Calculated using the CKD-EPI Creatinine Equation (2021) ?  ? Anion gap 8 5 - 15  ?  Comment: Performed at [facility], [address], [place], [place] [address]  ?CBC with Differential     Status: Abnormal  ? Collection Time: 10/06/21  9:22 AM  ?Result Value Ref Range  ? WBC 11.7 (H) 4.0 - 10.5 K/uL  ? RBC 5.19 4.22 - 5.81 MIL/uL  ?  Hemoglobin 12.9 (L) 13.0 - 17.0 g/dL  ? HCT 43.4 39.0 - 52.0 %  ? MCV 83.6 80.0 - 100.0 fL  ? MCH 24.9 (L) 26.0 - 34.0 pg  ? MCHC 29.7 (L) 30.0 - 36.0 g/dL  ? RDW 14.6 11.5 - 15.5 %  ? Platelets 292 150 - 400 K/uL  ? nRBC 0.0 0.0 - 0.2 %  ? Neutrophils Relative % 64 %  ? Neutro Abs 7.4 1.7 - 7.7 K/uL  ? Lymphocytes Relative 28 %  ? Lymphs Abs 3.3 0.7 - 4.0 K/uL  ? Monocytes Relative 6 %  ? Monocytes Absolute 0.7 0.1 - 1.0 K/uL  ? Eosinophils Relative 2 %  ? Eosinophils Absolute 0.2 0.0 - 0.5 K/uL  ? Basophils Relative 0 %  ? Basophils Absolute 0.1 0.0 - 0.1 K/uL  ? Immature Granulocytes 0 %  ? Abs Immature Granulocytes 0.04 0.00 - 0.07 K/uL  ?  Comment: Performed at [facility], [address] [place], [place] [address]  ?Sedimentation rate     Status: Abnormal  ? Collection Time: 10/06/21  9:22 AM  ?Result Value Ref Range  ? Sed Rate 26 (H) 0 - 15 mm/hr  ?  Comment: Performed at [facility], [address] [place], [place] [address]  ?Protime-INR     Status: None  ? Collection Time: 10/06/21 12:44 PM  ?Result Value Ref Range  ? Prothrombin Time 13.4 11.4 - 15.2 seconds  ? INR 1.0 0.8 - 1.2  ?  Comment: (NOTE) ?INR goal varies based on device and disease states. ?Performed at [facility], [place], ?[place] [address] ?  ?APTT     Status: None  ? Collection Time: 10/06/21 12:44 PM  ?Result Value Ref Range  ? aPTT 31 24 - 36  seconds  ?  Comment: Performed at [facility], [address], [place], [place] [address]  ?Marland Kitchen CT Soft Tissue Neck W Contrast ? ?Result Date: 10/06/2021 ?CLINICAL DATA:  46 year old male with left s

## 2021-10-07 LAB — CBC
HCT: 40.3 % (ref 39.0–52.0)
Hemoglobin: 11.9 g/dL — ABNORMAL LOW (ref 13.0–17.0)
MCH: 24.6 pg — ABNORMAL LOW (ref 26.0–34.0)
MCHC: 29.5 g/dL — ABNORMAL LOW (ref 30.0–36.0)
MCV: 83.4 fL (ref 80.0–100.0)
Platelets: 287 10*3/uL (ref 150–400)
RBC: 4.83 MIL/uL (ref 4.22–5.81)
RDW: 14.7 % (ref 11.5–15.5)
WBC: 13.4 10*3/uL — ABNORMAL HIGH (ref 4.0–10.5)
nRBC: 0 % (ref 0.0–0.2)

## 2021-10-07 LAB — CREATININE, SERUM
Creatinine, Ser: 0.74 mg/dL (ref 0.61–1.24)
GFR, Estimated: >60 mL/min (ref >60)

## 2021-10-07 NOTE — TOC Initial Note (Signed)
Transition of Care [facility]) - Initial/Assessment Note    Patient Details  Name: [name] MRN: [id number] Date of Birth: [birth year]  Transition of Care [facility]) CM/SW Contact:    Marlowe Sax, RN Phone Number: 10/07/2021, 9:19 AM  Clinical Narrative:                  Transition of Care (TOC) Screening Note   Patient Details  Name: [name] Date of Birth: [birth year]   Transition of Care [facility]) CM/SW Contact:    Marlowe Sax, RN Phone Number: 10/07/2021, 9:20 AM  Patient from home and is independent, [facility], Gets meds at [facility]  Girlfriend to transport  Transition of Care Department [facility]) has reviewed patient and no TOC needs have been identified at this time. We will continue to monitor patient advancement through interdisciplinary progression rounds. If new patient transition needs arise, please place a TOC consult.          Patient Goals and CMS Choice        Expected Discharge Plan and Services                                                Prior Living Arrangements/Services                       Activities of Daily Living Home Assistive Devices/Equipment: None ADL Screening (condition at time of admission) Patient's cognitive ability adequate to safely complete daily activities?: Yes Is the patient deaf or have difficulty hearing?: No Does the patient have difficulty seeing, even when wearing glasses/contacts?: No Does the patient have difficulty concentrating, remembering, or making decisions?: No Patient able to express need for assistance with ADLs?: Yes Does the patient have difficulty dressing or bathing?: No Independently performs ADLs?: Yes (appropriate for developmental age) Does the patient have difficulty walking or climbing stairs?: No Weakness of Legs: None Weakness of Arms/Hands: None  Permission Sought/Granted                  Emotional  Assessment              Admission diagnosis:  Cellulitis and abscess of neck [L03.221, L02.11] Patient Active Problem List   Diagnosis Date Noted   Cellulitis and abscess of neck 10/06/2021   Morbid obesity with BMI of 60.0-69.9, adult (HCC) 11/01/2017   GERD (gastroesophageal reflux disease) 11/01/2017   Chest pain with moderate risk for cardiac etiology 10/31/2017   Smoker 10/31/2017   PCP:  [facility]:   [id number] [place], [place] - [address] AT [facility] OF [address] [facility] [place] [address] Phone: [phone] Fax: [phone]  [id number] [place], [place] - [address] AT [facility] OF [address] [place] [address] Phone: [phone] Fax: [phone]  [facility] [address]), [place] - [address] [place]) [place] [address] Phone: [phone] Fax: [phone]     Social Determinants of Health (SDOH) Interventions    Readmission Risk Interventions     View : No data to display.

## 2021-10-07 NOTE — Progress Notes (Signed)
  Progress Note   Patient: [name] RFF:[id number] DOB: 04-24-1976 DOA: 10/06/2021     1 DOS: the patient was seen and examined on 10/07/2021   Brief hospital course: [name] is a 46 y.o. male with medical history significant of GERD, morbid obesity with BMI 61.85,  form smoker, who presents with neck pain.  She now has a left neck abscess, I&D was performed on 5/17.  Cultures are pending.   Assessment and Plan: Left neck abscess. Status post I&D.  Condition improving, culture sent out and pending results.  Blood culture negative in 24 hours.  Continue current antibiotics until final culture results available  Morbid obesity. Diet exercise advised.     Subjective: Doing well, left neck pain improving.  No fever or chills.  Physical Exam: Vitals:   10/06/21 1958 10/07/21 0007 10/07/21 0506 10/07/21 0809  BP: (!) 142/89 128/79 139/87 (!) 146/93  Pulse: 85 75 86 91  Resp: 18 18 18 17   Temp: 98.1 F (36.7 C) 98.2 F (36.8 C) 98.4 F (36.9 C) 97.8 F (36.6 C)  TempSrc:  Oral    SpO2: 96% 100% 95% 95%  Weight:      Height:       General exam: Appears calm and comfortable, morbid obese Respiratory system: Clear to auscultation. Respiratory effort normal. Cardiovascular system: S1 & S2 heard, RRR. No JVD, murmurs, rubs, gallops or clicks. No pedal edema. Gastrointestinal system: Abdomen is nondistended, soft and nontender. No organomegaly or masses felt. Normal bowel sounds heard. Central nervous system: Alert and oriented. No focal neurological deficits. Extremities: Symmetric 5 x 5 power. Skin: No rashes, lesions or ulcers Psychiatry: Judgement and insight appear normal. Mood & affect appropriate. ' Data Reviewed:  Results reviewed. CT results reviewed  Family Communication:   Disposition: Status is: Inpatient Remains inpatient appropriate because: Severity of disease, IV treatment.  Planned Discharge Destination: Home    Time spent:  28 minutes  Author: , MD 10/07/2021 3:58 PM  For on call review www.10/09/2021.

## 2021-10-07 NOTE — Final Consult Note (Signed)
Consultant Final Sign-Off Note    Assessment/Final recommendations  [name] is a 46 y.o. male followed by me for left posterior neck abscess.  Yesterday I aspirated about 1-1/2 cc of cloudy serous fluid and sent this for culture.  He says his neck started feeling better later in the afternoon after this.  He still has induration there but tenderness is significantly improved.   Wound care (if applicable):    Diet at discharge: per primary team   Activity at discharge: ad lib   Follow-up appointment:   Can follow-up with me next week after discharge  Pending results:  Unresulted Labs (From admission, onward)    None        Medication recommendations: Certainly suspect staph, possible MRSA as a major organism in this infection.  Doxycycline or Bactrim would provide good coverage for MRSA.  He also had gram-negative rods and rare gram-positive rods, so difficult to speculate on best coverage for these organisms without final culture results.  Doxycycline could provide some coverage for gram-negative rods but without a specific organism it is hard to tell whether he might need a quinolone.  Other recommendations: Warm compresses, ibuprofen as needed.  If he has any worsening symptoms, please contact the physician on-call for the weekend.   Thank you for allowing [place] to participate in the care of your patient!  Please consult [place] again if you have further needs for your patient.  [name] 10/07/2021 12:33 PM    Subjective     Objective  Vital signs in last 24 hours: Temp:  [97.8 F (36.6 C)-98.4 F (36.9 C)] 97.8 F (36.6 C) (05/18 0809) Pulse Rate:  [75-91] 91 (05/18 0809) Resp:  [17-18] 17 (05/18 0809) BP: (126-146)/(65-93) 146/93 (05/18 0809) SpO2:  [95 %-100 %] 95 % (05/18 0809)  General: The left posterior neck still has some induration but tenderness is minimal compared to yesterday, clearly improved.  No fluctuance is palpable   Pertinent  labs and Studies: Recent Labs    10/06/21 0922 10/07/21 0333  WBC 11.7* 13.4*  HGB 12.9* 11.9*  HCT 43.4 40.3   BMET Recent Labs    10/06/21 0922 10/07/21 0333  NA 139  --   K 4.2  --   CL 102  --   CO2 29  --   GLUCOSE 159*  --   BUN 7  --   CREATININE 0.86 0.74  CALCIUM 9.0  --    No results for input(s): LABURIN in the last 72 hours. Results for orders placed or performed during the hospital encounter of 10/06/21  Culture, blood (Routine X 2) w Reflex to ID Panel     Status: None (Preliminary result)   Collection Time: 10/06/21 11:55 AM   Specimen: BLOOD  Result Value Ref Range Status   Specimen Description BLOOD LEFT ARM  Final   Special Requests   Final    BOTTLES DRAWN AEROBIC AND ANAEROBIC Blood Culture adequate volume   Culture   Final    NO GROWTH < 24 HOURS Performed at [facility], [address] [place], [place] [address]    Report Status PENDING  Incomplete  Culture, blood (Routine X 2) w Reflex to ID Panel     Status: None (Preliminary result)   Collection Time: 10/06/21 11:55 AM   Specimen: BLOOD  Result Value Ref Range Status   Specimen Description BLOOD RIGHT ANTECUBITAL  Final   Special Requests   Final    BOTTLES  DRAWN AEROBIC AND ANAEROBIC Blood Culture adequate volume   Culture   Final    NO GROWTH < 24 HOURS Performed at [facility], [address] [place], [place] [address]    Report Status PENDING  Incomplete  Aerobic/Anaerobic Culture w Gram Stain (surgical/deep wound)     Status: None (Preliminary result)   Collection Time: 10/06/21  1:28 PM   Specimen: Abscess  Result Value Ref Range Status   Specimen Description   Final    ABSCESS Performed at [facility], [address] [place], [place] [address]    Special Requests   Final    Normal Performed at [facility], [address] [place], [place] [address]    Gram Stain   Final    NO WBC SEEN MODERATE GRAM POSITIVE COCCI IN  PAIRS MODERATE GRAM NEGATIVE RODS RARE GRAM POSITIVE RODS    Culture   Final    NO GROWTH < 24 HOURS Performed at [facility], [place] [address] [place], [place] [address]    Report Status PENDING  Incomplete    Imaging: No results found.

## 2021-10-08 MED ORDER — CIPROFLOXACIN HCL 500 MG PO TABS: 500.0000 mg | ORAL_TABLET | Freq: Two times a day (BID) | ORAL | 0 refills | Status: AC

## 2021-10-08 MED ORDER — DOXYCYCLINE HYCLATE 100 MG PO TBEC
100.0000 mg | DELAYED_RELEASE_TABLET | Freq: Two times a day (BID) | ORAL | 0 refills | Status: AC
Start: 2021-10-08 — End: 2021-10-13

## 2021-10-08 NOTE — Progress Notes (Signed)
[name] to be D/C'd Home per MD order.  Discussed prescriptions and follow up appointments with the patient and wife. Prescriptions given to patient, medication list explained in detail. Pt verbalized understanding.  Allergies as of 10/08/2021   No Known Allergies      Medication List     TAKE these medications    ciprofloxacin 500 MG tablet Commonly known as: Cipro Take 1 tablet (500 mg total) by mouth 2 (two) times daily for 5 days.   doxycycline 100 MG EC tablet Commonly known as: DORYX Take 1 tablet (100 mg total) by mouth 2 (two) times daily for 5 days.   famotidine 20 MG tablet Commonly known as: Pepcid Take 1 tablet (20 mg total) by mouth daily.   sucralfate 1 g tablet Commonly known as: Carafate Take 1 tablet (1 g total) by mouth 4 (four) times daily.        Vitals:   10/08/21 0815 10/08/21 1117  BP: 119/70 134/69  Pulse: 78 75  Resp: 17 17  Temp: 98.6 F (37 C) 98.5 F (36.9 C)  SpO2: 95% 96%    Skin clean, dry and intact without evidence of skin break down, no evidence of skin tears noted. IV catheter discontinued intact. Site without signs and symptoms of complications. Dressing and pressure applied. Pt denies pain at this time. No complaints noted.  An After Visit Summary was printed and given to the patient. Patient escorted via WC, and D/C home via private auto.  [name]

## 2021-10-08 NOTE — Discharge Summary (Signed)
Physician Discharge Summary   Patient: [name] MRN: [id number] DOB: [birth year]  Admit date:     10/06/2021  Discharge date: 10/08/21  Discharge Physician: [name]   PCP: Valenzuela, [facility]   Recommendations at discharge:   Follow-up with PCP in 1 week.  Discharge Diagnoses: Principal Problem:   Cellulitis and abscess of neck Active Problems:   Morbid obesity with BMI of 60.0-69.9, adult (HCC)   GERD (gastroesophageal reflux disease)  Resolved Problems:   * No resolved hospital problems. *  Hospital Course: Md [name] is a 46 y.o. male with medical history significant of GERD, morbid obesity with BMI 61.85,  form smoker, who presents with neck pain.  She now has a left neck abscess, I&D was performed on 5/17.  Blood culture negative, wound culture has mixed gram-positive cocci and gram-negative rods. Condition has improved, will treat with additional 5 days of Cipro and doxycycline. Assessment and Plan: Left neck abscess. Status post I&D. Condition had improved, however treat with additional 5 days of antibiotics with the Cipro and doxycycline.  Based on my examination, abscess essentially resolved.  Patient be followed by PCP in 1 week.  Morbid obesity. Diet exercise advised.         Consultants: General surgery Procedures performed: I&D  Disposition: Home Diet recommendation:  Discharge Diet Orders (From admission, onward)     Start     Ordered   10/08/21 0000  Diet - low sodium heart healthy        10/08/21 1048           Cardiac diet DISCHARGE MEDICATION: Allergies as of 10/08/2021   No Known Allergies      Medication List     TAKE these medications    ciprofloxacin 500 MG tablet Commonly known as: Cipro Take 1 tablet (500 mg total) by mouth 2 (two) times daily for 5 days.   doxycycline 100 MG EC tablet Commonly known as: DORYX Take 1 tablet (100 mg total) by mouth 2 (two) times daily for 5  days.   famotidine 20 MG tablet Commonly known as: Pepcid Take 1 tablet (20 mg total) by mouth daily.   sucralfate 1 g tablet Commonly known as: Carafate Take 1 tablet (1 g total) by mouth 4 (four) times daily.        Follow-up Information     Valenzuela, [name] Follow up in 1 week(s).   Specialty: General Practice Contact information: [address] [place] [address] [phone]                Discharge Exam: [name] Weights   10/06/21 0846 10/06/21 0848  Weight: (!) 166 kg (!) 166 kg   General exam: Appears calm and comfortable, morbid obesity Respiratory system: Clear to auscultation. Respiratory effort normal. Cardiovascular system: S1 & S2 heard, RRR. No JVD, murmurs, rubs, gallops or clicks. No pedal edema. Gastrointestinal system: Abdomen is nondistended, soft and nontender. No organomegaly or masses felt. Normal bowel sounds heard. Central nervous system: Alert and oriented. No focal neurological deficits. Extremities: Symmetric 5 x 5 power. Skin: No rashes, lesions or ulcers Psychiatry: Judgement and insight appear normal. Mood & affect appropriate.  Posterior neck no evidence of abscess this time  Condition at discharge: good  The results of significant diagnostics from this hospitalization (including imaging, microbiology, ancillary and laboratory) are listed below for reference.   Imaging Studies: CT Soft Tissue Neck W Contrast  Result Date: 10/06/2021  CLINICAL DATA:  46 year old male with left side neck pain and swelling starting 2 days ago. EXAM: CT NECK WITH CONTRAST TECHNIQUE: Multidetector CT imaging of the neck was performed using the standard protocol following the bolus administration of intravenous contrast. RADIATION DOSE REDUCTION: This exam was performed according to the departmental dose-optimization program which includes automated exposure control, adjustment of the mA and/or kV according to patient  size and/or use of iterative reconstruction technique. CONTRAST:  75mL OMNIPAQUE IOHEXOL 300 MG/ML  SOLN COMPARISON:  Cervical spine CT 11/13/2016. FINDINGS: Pharynx and larynx: Negative. Salivary glands: Negative. Thyroid: Negative. Lymph nodes: In general the bilateral cervical lymph nodes are diminutive and within normal limits. Left posterolateral neck there is an indistinct roughly 9 cm area of subcutaneous inflammatory stranding underlying chronic skin thickening (series 6, image 140). Inflammation involves the underlying superficial trapezius muscle, and at the junction of the subcutaneous fat and muscle there is an oval 15 x 15 x 21 mm low-density fluid collection. Inflammation also involves the left suprascapular muscle. See series 2, image 45, coronal image 140. Nearby asymmetrically enlarged left level IIIb lymph node is 11 mm short axis and appears reactive. No soft tissue gas. Superimposed right suboccipital area of chronic scalp soft tissue scarring on series 2, image 28 is unchanged since 2018. Vascular: Suboptimal intravascular contrast bolus but the major vascular structures in the neck and at the skull base appear to be patent. There is venous contrast reflux superficially in the upper left chest. Limited intracranial: Negative. Visualized orbits: Negative. Mastoids and visualized paranasal sinuses: Chronic maxillary sinus mucoperiosteal thickening. Other paranasal sinuses are well aerated. Tympanic cavities and mastoids are clear. Skeleton: Carious left maxillary posterior bicuspid, right mandible anterior bicuspid. No acute osseous abnormality identified. Upper chest: Negative visible superior mediastinum aside from some lipomatosis. Visible major airways are patent through the carina. Mild upper lung atelectasis. IMPRESSION: 1. Evidence of Cellulitis and superficial Myositis in the left posterolateral neck with a 2.1 cm Abscess at the junction of the subcutaneous fat and muscle (sagittal image  93). Nearby reactive appearing left level IIIb lymph node. 2. No other complicating features. No other acute finding in the Neck; carious left maxillary and right mandible anterior bicuspids. Electronically Signed   By: Anthony FlemingH  Hall M.D.   On: 10/06/2021 10:17    Microbiology: Results for orders placed or performed during the hospital encounter of 10/06/21  Culture, blood (Routine X 2) w Reflex to ID Panel     Status: None (Preliminary result)   Collection Time: 10/06/21 11:55 AM   Specimen: BLOOD  Result Value Ref Range Status   Specimen Description BLOOD LEFT ARM  Final   Special Requests   Final    BOTTLES DRAWN AEROBIC AND ANAEROBIC Blood Culture adequate volume   Culture   Final    NO GROWTH 2 DAYS Performed at [facility], [address] [place], [place] [address]    Report Status PENDING  Incomplete  Culture, blood (Routine X 2) w Reflex to ID Panel     Status: None (Preliminary result)   Collection Time: 10/06/21 11:55 AM   Specimen: BLOOD  Result Value Ref Range Status   Specimen Description BLOOD RIGHT ANTECUBITAL  Final   Special Requests   Final    BOTTLES DRAWN AEROBIC AND ANAEROBIC Blood Culture adequate volume   Culture   Final    NO GROWTH 2 DAYS Performed at [facility], [address] [place], [place] [address]    Report  Status PENDING  Incomplete  Aerobic/Anaerobic Culture w Gram Stain (surgical/deep wound)     Status: None (Preliminary result)   Collection Time: 10/06/21  1:28 PM   Specimen: Abscess  Result Value Ref Range Status   Specimen Description   Final    ABSCESS Performed at [facility], [address], [place], [place] [address]    Special Requests   Final    Normal Performed at [facility], [address] [place], [place] [address]    Gram Stain   Final    NO WBC SEEN MODERATE GRAM POSITIVE COCCI IN PAIRS MODERATE GRAM NEGATIVE RODS RARE GRAM POSITIVE RODS    Culture   Final    CULTURE REINCUBATED  FOR BETTER GROWTH Performed at [facility], [address] [place], [place] [address]    Report Status PENDING  Incomplete    Labs: CBC: Recent Labs  Lab 10/06/21 0922 10/07/21 0333  WBC 11.7* 13.4*  NEUTROABS 7.4  --   HGB 12.9* 11.9*  HCT 43.4 40.3  MCV 83.6 83.4  PLT 292 287   Basic Metabolic Panel: Recent Labs  Lab 10/06/21 0922 10/07/21 0333  NA 139  --   K 4.2  --   CL 102  --   CO2 29  --   GLUCOSE 159*  --   BUN 7  --   CREATININE 0.86 0.74  CALCIUM 9.0  --    Liver Function Tests: No results for input(s): AST, ALT, ALKPHOS, BILITOT, PROT, ALBUMIN in the last 168 hours. CBG: Recent Labs  Lab 10/06/21 2125  GLUCAP 121*    Discharge time spent: less than 30 minutes.  Signed: Marrion Coy, MD [facility] 10/08/2021

## 2021-10-11 LAB — CULTURE, BLOOD (ROUTINE X 2)
Culture: NO GROWTH
Culture: NO GROWTH
Special Requests: ADEQUATE
Special Requests: ADEQUATE

## 2021-10-11 LAB — AEROBIC/ANAEROBIC CULTURE W GRAM STAIN (SURGICAL/DEEP WOUND)
Culture: NORMAL
Gram Stain: NONE SEEN
Special Requests: NORMAL

## 2022-04-22 DIAGNOSIS — Z419 Encounter for procedure for purposes other than remedying health state, unspecified: Secondary | ICD-10-CM | POA: Diagnosis not present

## 2022-04-25 DIAGNOSIS — R030 Elevated blood-pressure reading, without diagnosis of hypertension: Secondary | ICD-10-CM | POA: Insufficient documentation

## 2022-04-25 DIAGNOSIS — K219 Gastro-esophageal reflux disease without esophagitis: Secondary | ICD-10-CM | POA: Diagnosis not present

## 2022-04-25 DIAGNOSIS — Z1331 Encounter for screening for depression: Secondary | ICD-10-CM | POA: Diagnosis not present

## 2022-04-25 DIAGNOSIS — R7309 Other abnormal glucose: Secondary | ICD-10-CM | POA: Diagnosis not present

## 2022-04-25 DIAGNOSIS — Z712 Person consulting for explanation of examination or test findings: Secondary | ICD-10-CM | POA: Diagnosis not present

## 2022-04-25 DIAGNOSIS — Z23 Encounter for immunization: Secondary | ICD-10-CM | POA: Diagnosis not present

## 2022-04-25 DIAGNOSIS — Z1389 Encounter for screening for other disorder: Secondary | ICD-10-CM | POA: Diagnosis not present

## 2022-04-25 DIAGNOSIS — G4733 Obstructive sleep apnea (adult) (pediatric): Secondary | ICD-10-CM | POA: Diagnosis not present

## 2022-04-25 DIAGNOSIS — E119 Type 2 diabetes mellitus without complications: Secondary | ICD-10-CM | POA: Diagnosis not present

## 2022-04-25 DIAGNOSIS — H93A2 Pulsatile tinnitus, left ear: Secondary | ICD-10-CM | POA: Diagnosis not present

## 2022-05-23 DIAGNOSIS — Z419 Encounter for procedure for purposes other than remedying health state, unspecified: Secondary | ICD-10-CM | POA: Diagnosis not present

## 2022-06-04 IMAGING — CT CT NECK W/ CM
4 of 6 series · 12 of 33 positions shown, 14 images · IV contrast (APPLIED)
Comparison: Cervical spine CT 11/13/2016.

CLINICAL DATA: 46-year-old male with left side neck pain and
swelling starting 2 days ago.

EXAM:
CT NECK WITH CONTRAST
TECHNIQUE: Multidetector CT imaging of the neck was performed using the
standard protocol following the bolus administration of intravenous
contrast.

[Series 2: axial neck · axial · 0.76mm/px · z∈[-242,-152]mm · 2 of 137 slices shown]
[im 46/137  bone]
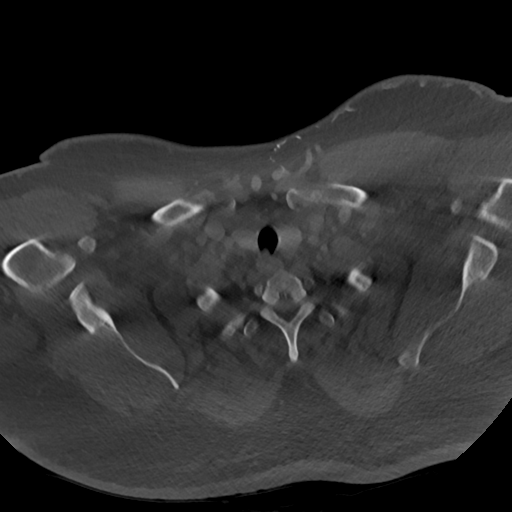
[im 91/137  bone]
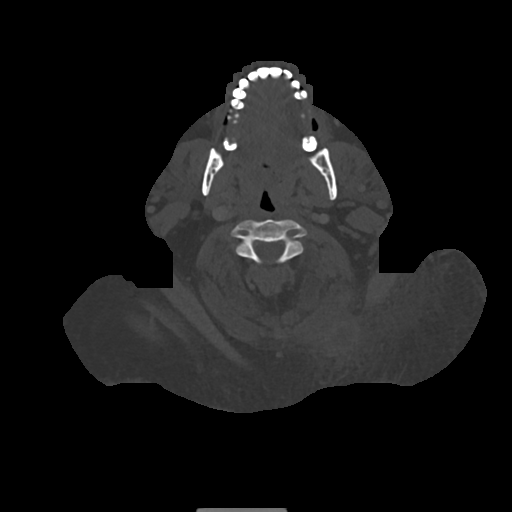

[Series 3: sag neck · sagittal · 0.66mm/px · 5 of 132 slices shown, 6 images]
[im 44/132  bone]
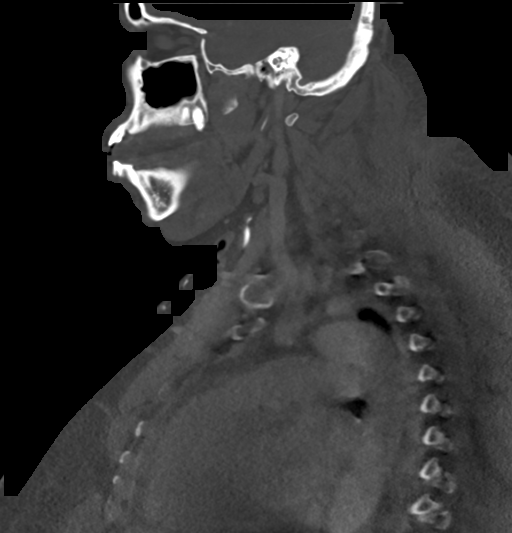
[im 55/132  bone]
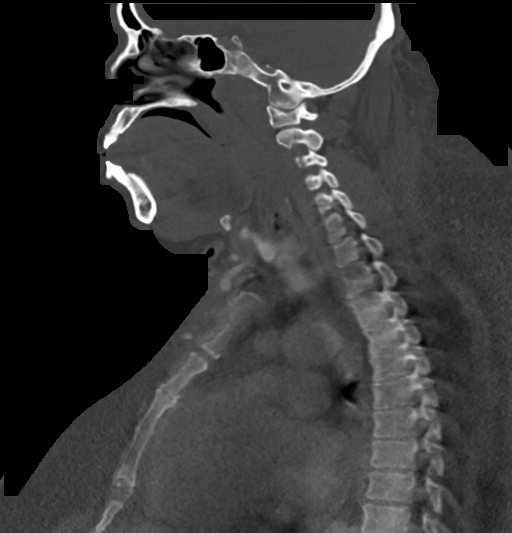
[im 66/132  soft-tissue]
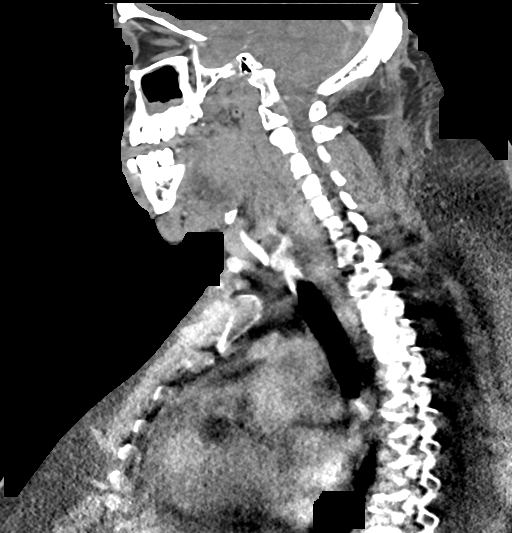
[im 66/132  bone]
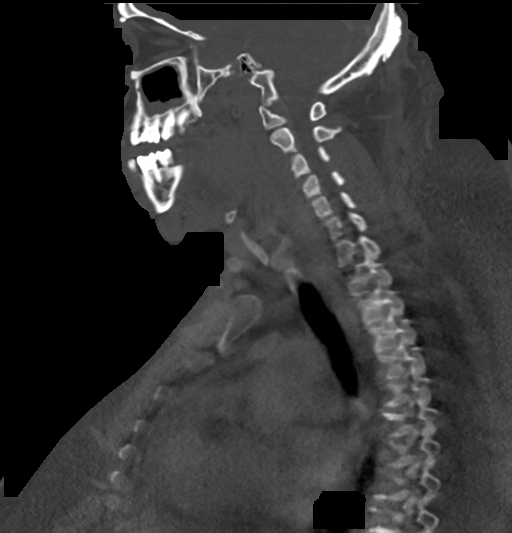
[im 77/132  bone]
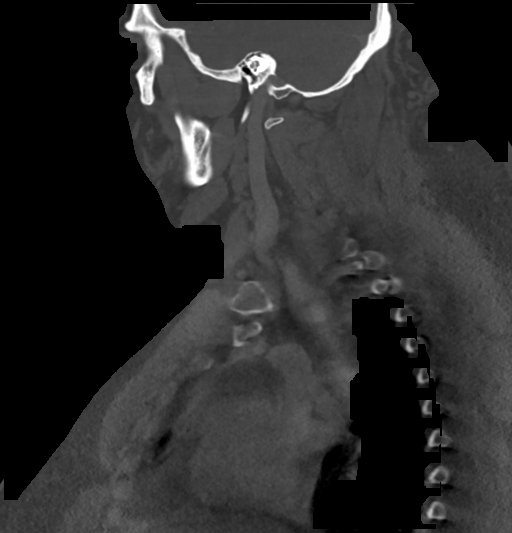
[im 88/132  bone]
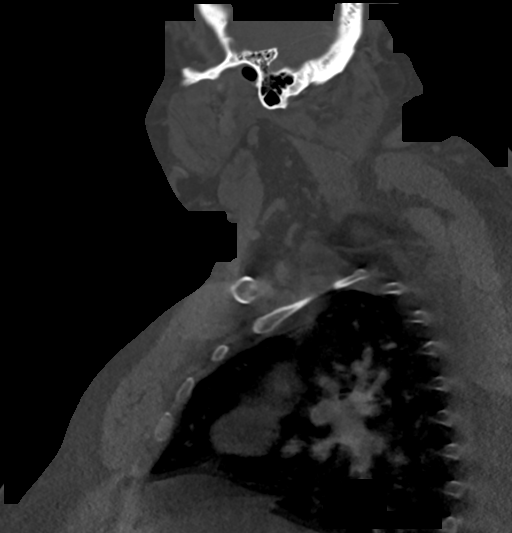

[Series 6: cor neck · coronal · 0.51mm/px · 3 of 170 slices shown]
[im 34/170  bone]
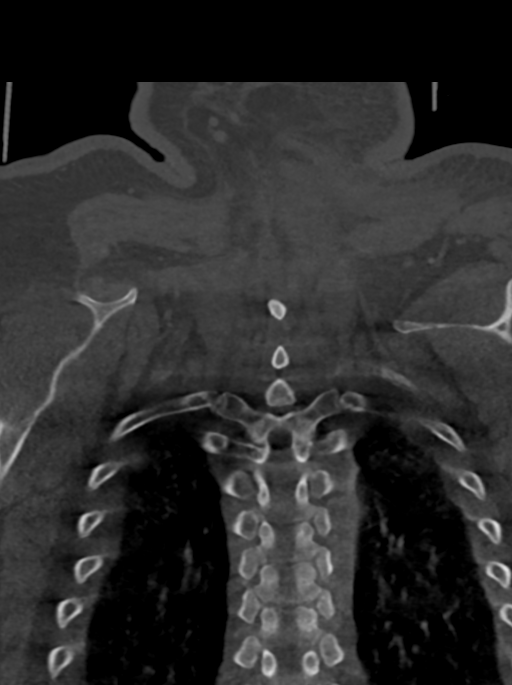
[im 68/170  bone]
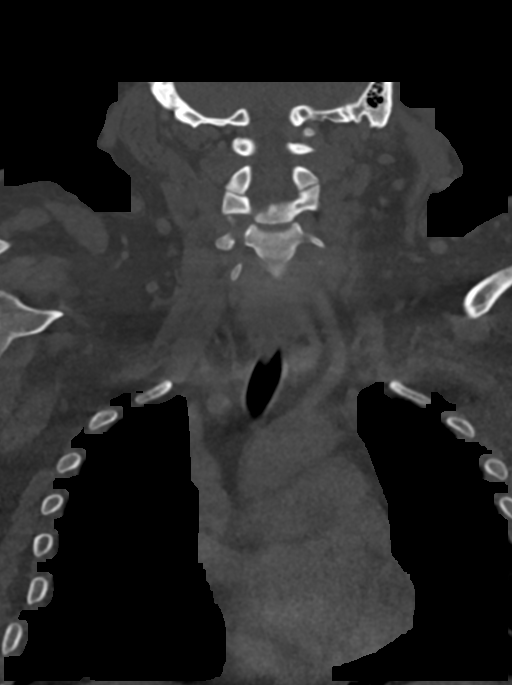
[im 102/170  bone]
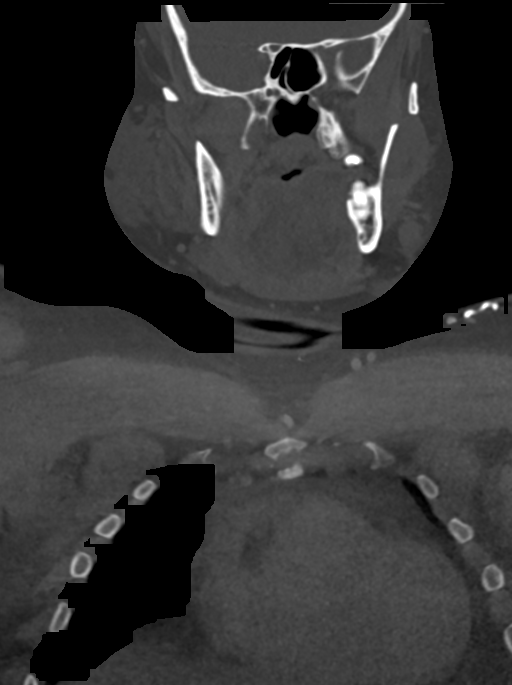

[Series 7: ax oropharynx · axial · 0.51mm/px · z∈[-292,-174]mm · 2 of 177 slices shown, 3 images]
[im 59/177  soft-tissue]
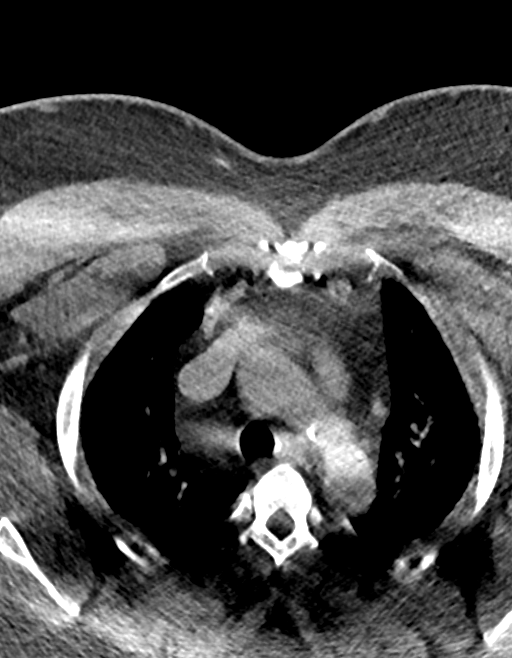
[im 59/177  bone]
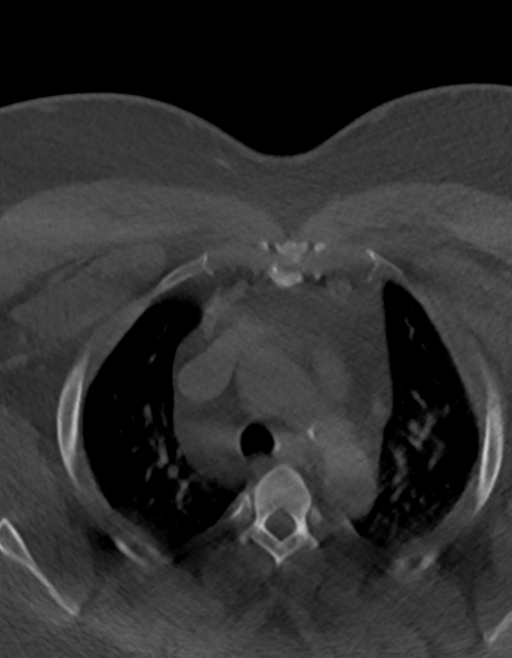
[im 118/177  bone]
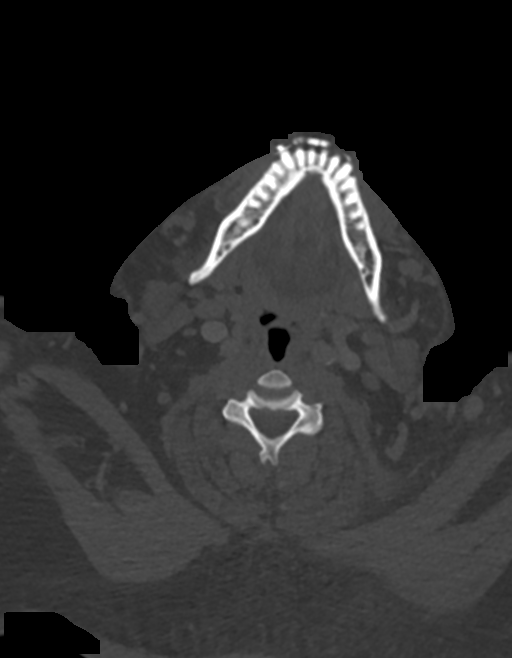

[12 of 33 positions shown; findings below may reference images not displayed]

RADIATION DOSE REDUCTION: This exam was performed according to the
departmental dose-optimization program which includes automated
exposure control, adjustment of the mA and/or kV according to
patient size and/or use of iterative reconstruction technique.

CONTRAST:  75mL OMNIPAQUE IOHEXOL 300 MG/ML  SOLN
FINDINGS: Pharynx and larynx: Negative.

Salivary glands: Negative.

Thyroid: Negative.

Lymph nodes: In general the bilateral cervical lymph nodes are
diminutive and within normal limits.

Left posterolateral neck there is an indistinct roughly 9 cm area of
subcutaneous inflammatory stranding underlying chronic skin
thickening (series 6, image 140). Inflammation involves the
underlying superficial trapezius muscle, and at the junction of the
subcutaneous fat and muscle there is an oval 15 x 15 x 21 mm
low-density fluid collection. Inflammation also involves the left
suprascapular muscle. See series 2, image 45, coronal image 140.
Nearby asymmetrically enlarged left level IIIb lymph node is 11 mm
short axis and appears reactive. No soft tissue gas.

Superimposed right suboccipital area of chronic scalp soft tissue
scarring on series 2, image 28 is unchanged since 0444.

Vascular: Suboptimal intravascular contrast bolus but the major
vascular structures in the neck and at the skull base appear to be
patent. There is venous contrast reflux superficially in the upper
left chest.

Limited intracranial: Negative.

Visualized orbits: Negative.

Mastoids and visualized paranasal sinuses: Chronic maxillary sinus
mucoperiosteal thickening. Other paranasal sinuses are well aerated.
Tympanic cavities and mastoids are clear.

Skeleton: Carious left maxillary posterior bicuspid, right mandible
anterior bicuspid. No acute osseous abnormality identified.

Upper chest: Negative visible superior mediastinum aside from some
lipomatosis. Visible major airways are patent through the carina.
Mild upper lung atelectasis.
IMPRESSION: 1. Evidence of Cellulitis and superficial Myositis in the left
posterolateral neck with a 2.1 cm Abscess at the junction of the
subcutaneous fat and muscle (sagittal image 93). Nearby reactive
appearing left level IIIb lymph node.

2. No other complicating features. No other acute finding in the
Neck; carious left maxillary and right mandible anterior bicuspids.

## 2022-06-07 DIAGNOSIS — H5213 Myopia, bilateral: Secondary | ICD-10-CM | POA: Diagnosis not present

## 2022-06-08 DIAGNOSIS — H5213 Myopia, bilateral: Secondary | ICD-10-CM | POA: Diagnosis not present

## 2022-06-14 DIAGNOSIS — Z1389 Encounter for screening for other disorder: Secondary | ICD-10-CM | POA: Diagnosis not present

## 2022-06-14 DIAGNOSIS — R030 Elevated blood-pressure reading, without diagnosis of hypertension: Secondary | ICD-10-CM | POA: Diagnosis not present

## 2022-06-14 DIAGNOSIS — E119 Type 2 diabetes mellitus without complications: Secondary | ICD-10-CM | POA: Diagnosis not present

## 2022-06-14 DIAGNOSIS — Z712 Person consulting for explanation of examination or test findings: Secondary | ICD-10-CM | POA: Diagnosis not present

## 2022-06-14 DIAGNOSIS — G4733 Obstructive sleep apnea (adult) (pediatric): Secondary | ICD-10-CM | POA: Diagnosis not present

## 2022-06-14 DIAGNOSIS — H93A2 Pulsatile tinnitus, left ear: Secondary | ICD-10-CM | POA: Diagnosis not present

## 2022-06-14 DIAGNOSIS — Z1331 Encounter for screening for depression: Secondary | ICD-10-CM | POA: Diagnosis not present

## 2022-06-16 DIAGNOSIS — Z1331 Encounter for screening for depression: Secondary | ICD-10-CM | POA: Diagnosis not present

## 2022-06-16 DIAGNOSIS — Z1389 Encounter for screening for other disorder: Secondary | ICD-10-CM | POA: Diagnosis not present

## 2022-06-16 DIAGNOSIS — E119 Type 2 diabetes mellitus without complications: Secondary | ICD-10-CM | POA: Diagnosis not present

## 2022-06-16 DIAGNOSIS — Z712 Person consulting for explanation of examination or test findings: Secondary | ICD-10-CM | POA: Diagnosis not present

## 2022-06-23 DIAGNOSIS — Z419 Encounter for procedure for purposes other than remedying health state, unspecified: Secondary | ICD-10-CM | POA: Diagnosis not present

## 2022-07-22 DIAGNOSIS — Z419 Encounter for procedure for purposes other than remedying health state, unspecified: Secondary | ICD-10-CM | POA: Diagnosis not present

## 2022-08-12 NOTE — Progress Notes (Signed)
Sleep Medicine   Office Visit  Patient Name: [name] DOB: [birth year] MRN [id number]    Chief Complaint: sleep evaluation   Brief History: v [name] presents with a 15 year history of loud snoring, excessive daytime sleepiness. Patient states his sleep quality is very poor. This is noted all nights. The patient's bed partner reports  loud snoring at night as well as  witnessed apnea and falling asleep spontaneously during the day.The patient relates the following symptoms: snoring that wakes him, excessive sleepiness,brain fog, lack of focus, headaches and unable to perform his work due to sleepiness are also present. The patients sleep schedule is not regular as he dozes and sleeps off and on day and night.  Sleep quality is the same when outside home environment.  Patient has noted restlessness of his legs at night.  The patient  relates talking, dream enactment behavior during the night.  The patient denies a history of psychiatric problems. The Epworth Sleepiness Score is 24 out of 24 .  The patient relates  Cardiovascular risk factors include: none The patient reports that he had a home sleep study about 5 years ago and he was diagnosed with severe OSA. Due to no insurance, treatment never started.   ROS  General: (-) fever, (-) chills, (-) night sweat Nose and Sinuses: (-) nasal stuffiness or itchiness, (-) postnasal drip, (-) nosebleeds, (-) sinus trouble. Mouth and Throat: (-) sore throat, (-) hoarseness. Neck: (-) swollen glands, (-) enlarged thyroid, (-) neck pain. Respiratory: + cough, - shortness of breath, + wheezing. Neurologic: - numbness, - tingling. Psychiatric: - anxiety, - depression Sleep behavior: -sleep paralysis -hypnogogic hallucinations -dream enactment      -vivid dreams -cataplexy -night terrors -sleep walking   Current Medication: Outpatient Encounter Medications as of 08/15/2022  Medication Sig   metFORMIN (GLUCOPHAGE-XR) 500 MG 24 hr  tablet Take 500 mg by mouth daily.   famotidine (PEPCID) 20 MG tablet Take 1 tablet (20 mg total) by mouth daily.   sucralfate (CARAFATE) 1 g tablet Take 1 tablet (1 g total) by mouth 4 (four) times daily. (Patient not taking: Reported on 10/06/2021)   No facility-administered encounter medications on file as of 08/15/2022.    Surgical History: History reviewed. No pertinent surgical history.  Medical History: Past Medical History:  Diagnosis Date   Former smoker    GERD (gastroesophageal reflux disease)     Family History: Non contributory to the present illness  Social History: Social History   Socioeconomic History   Marital status: Single    Spouse name: Not on file   Number of children: Not on file   Years of education: Not on file   Highest education level: Not on file  Occupational History   Not on file  Tobacco Use   Smoking status: Former    Packs/day: 1.25    Years: 29.00    Additional pack years: 0.00    Total pack years: 36.25    Types: Cigarettes    Start date: 53    Quit date: 04/14/2019    Years since quitting: 3.3   Smokeless tobacco: Never  Vaping Use   Vaping Use: Never used  Substance and Sexual Activity   Alcohol use: Yes    Comment: rarely   Drug use: Never   Sexual activity: Not on file  Other Topics Concern   Not on file  Social History Narrative   Not on file   Social Determinants of Health   Financial Resource  Strain: Not on file  Food Insecurity: Not on file  Transportation Needs: Not on file  Physical Activity: Not on file  Stress: Not on file  Social Connections: Not on file  Intimate Partner Violence: Not on file    Vital Signs: Blood pressure (!) 157/80, pulse 89, resp. rate 20, height 5\' 6"  (1.676 m), weight (!) 385 lb (174.6 kg), SpO2 93 %. Body mass index is 62.14 kg/m.   Examination: General Appearance: The patient is well-developed, well-nourished, and in no distress. Neck Circumference: 55cm Skin: Gross  inspection of skin unremarkable. Head: normocephalic, no gross deformities. Eyes: no gross deformities noted. ENT: ears appear grossly normal Neurologic: Alert and oriented. No involuntary movements.    STOP BANG RISK ASSESSMENT S (snore) Have you been told that you snore?     YES   T (tired) Are you often tired, fatigued, or sleepy during the day?   YES  O (obstruction) Do you stop breathing, choke, or gasp during sleep? YES   P (pressure) Do you have or are you being treated for high blood pressure? NO   B (BMI) Is your body index greater than 35 kg/m? YES   A (age) Are you 34 years old or older? NO   N (neck) Do you have a neck circumference greater than 16 inches?   YES   G (gender) Are you a male? YES   TOTAL STOP/BANG "YES" ANSWERS 6                                                               A STOP-Bang score of 2 or less is considered low risk, and a score of 5 or more is high risk for having either moderate or severe OSA. For people who score 3 or 4, doctors may need to perform further assessment to determine how likely they are to have OSA.         EPWORTH SLEEPINESS SCALE:  Scale:  (0)= no chance of dozing; (1)= slight chance of dozing; (2)= moderate chance of dozing; (3)= high chance of dozing  Chance  Situtation    Sitting and reading: 3    Watching TV: 3    Sitting Inactive in public: 3    As a passenger in car: 3      Lying down to rest: 3    Sitting and talking: 3    Sitting quielty after lunch: 3    In a car, stopped in traffic: 3   TOTAL SCORE:   24 out of 24    SLEEP STUDIES  He reports having an HST study over 5 years ago which was positive for severe OSA. No treatment was started   LABS: No results found for this or any previous visit (from the past 2160 hour(s)).  Radiology: CT Soft Tissue Neck W Contrast  Result Date: 10/06/2021 CLINICAL DATA:  47 year old male with left side neck pain and swelling starting 2 days ago.  EXAM: CT NECK WITH CONTRAST TECHNIQUE: Multidetector CT imaging of the neck was performed using the standard protocol following the bolus administration of intravenous contrast. RADIATION DOSE REDUCTION: This exam was performed according to the departmental dose-optimization program which includes automated exposure control, adjustment of the mA and/or kV according to patient size and/or use of  iterative reconstruction technique. CONTRAST:  60mL OMNIPAQUE IOHEXOL 300 MG/ML  SOLN COMPARISON:  Cervical spine CT 11/13/2016. FINDINGS: Pharynx and larynx: Negative. Salivary glands: Negative. Thyroid: Negative. Lymph nodes: In general the bilateral cervical lymph nodes are diminutive and within normal limits. Left posterolateral neck there is an indistinct roughly 9 cm area of subcutaneous inflammatory stranding underlying chronic skin thickening (series 6, image 140). Inflammation involves the underlying superficial trapezius muscle, and at the junction of the subcutaneous fat and muscle there is an oval 15 x 15 x 21 mm low-density fluid collection. Inflammation also involves the left suprascapular muscle. See series 2, image 45, coronal image 140. Nearby asymmetrically enlarged left level IIIb lymph node is 11 mm short axis and appears reactive. No soft tissue gas. Superimposed right suboccipital area of chronic scalp soft tissue scarring on series 2, image 28 is unchanged since 2018. Vascular: Suboptimal intravascular contrast bolus but the major vascular structures in the neck and at the skull base appear to be patent. There is venous contrast reflux superficially in the upper left chest. Limited intracranial: Negative. Visualized orbits: Negative. Mastoids and visualized paranasal sinuses: Chronic maxillary sinus mucoperiosteal thickening. Other paranasal sinuses are well aerated. Tympanic cavities and mastoids are clear. Skeleton: Carious left maxillary posterior bicuspid, right mandible anterior bicuspid. No acute  osseous abnormality identified. Upper chest: Negative visible superior mediastinum aside from some lipomatosis. Visible major airways are patent through the carina. Mild upper lung atelectasis. IMPRESSION: 1. Evidence of Cellulitis and superficial Myositis in the left posterolateral neck with a 2.1 cm Abscess at the junction of the subcutaneous fat and muscle (sagittal image 93). Nearby reactive appearing left level IIIb lymph node. 2. No other complicating features. No other acute finding in the Neck; carious left maxillary and right mandible anterior bicuspids. Electronically Signed   By: Genevie Ann M.D.   On: 10/06/2021 10:17    No results found.  No results found.    Assessment and Plan: Patient Active Problem List   Diagnosis Date Noted   Cellulitis and abscess of neck 10/06/2021   Morbid obesity with BMI of 60.0-69.9, adult ([facility]) 11/01/2017   GERD (gastroesophageal reflux disease) 11/01/2017   Chest pain with moderate risk for cardiac etiology 10/31/2017   Smoker 10/31/2017   1. OSA (obstructive sleep apnea) PLAN OSA:   Patient evaluation suggests high risk of sleep disordered breathing due to morbid obesity, observed apnea, snoring, gasping, choking, frequent awakenings, and daytime sleepiness.  Pt in fact fell asleep 3 times while in office. Suggest: PSG to assess/treat the patient's sleep disordered breathing. The patient was also counselled on weight loss to optimize sleep health.  2. Morbid obesity with BMI of 60.0-69.9, adult ([place]) Obesity Counseling: Had a lengthy discussion regarding patients BMI and weight issues. Patient was instructed on portion control as well as increased activity. Also discussed caloric restrictions with trying to maintain intake less than 2000 Kcal. Discussions were made in accordance with the 5As of weight management. Simple actions such as not eating late and if able to, taking a walk is suggested.      General Counseling: I have discussed the findings  of the evaluation and examination with [name].  I have also discussed any further diagnostic evaluation thatmay be needed or ordered today. [name] verbalizes understanding of the findings of todays visit. We also reviewed his medications today and discussed drug interactions and side effects including but not limited excessive drowsiness and altered mental states. We also discussed that there is always  a risk not just to him but also people around him. he has been encouraged to call the office with any questions or concerns that should arise related to todays visit.  No orders of the defined types were placed in this encounter.       I have personally obtained a history, evaluated the patient, evaluated pertinent data, formulated the assessment and plan and placed orders.   This patient was seen today by Tressie Ellis, PA-C in collaboration with Dr. Devona Konig.   Allyne Gee, MD [facility] Diplomate ABMS Pulmonary and Critical Care Medicine Sleep medicine

## 2022-08-15 ENCOUNTER — Ambulatory Visit (INDEPENDENT_AMBULATORY_CARE_PROVIDER_SITE_OTHER): Payer: Medicaid Other | Admitting: Internal Medicine

## 2022-08-15 VITALS — BP 157/80 | HR 89 | Resp 20 | Ht 66.0 in | Wt 385.0 lb

## 2022-08-15 DIAGNOSIS — G4733 Obstructive sleep apnea (adult) (pediatric): Secondary | ICD-10-CM

## 2022-08-15 DIAGNOSIS — Z6841 Body Mass Index (BMI) 40.0 and over, adult: Secondary | ICD-10-CM | POA: Diagnosis not present

## 2022-08-22 DIAGNOSIS — Z419 Encounter for procedure for purposes other than remedying health state, unspecified: Secondary | ICD-10-CM | POA: Diagnosis not present

## 2022-09-01 DIAGNOSIS — Z712 Person consulting for explanation of examination or test findings: Secondary | ICD-10-CM | POA: Diagnosis not present

## 2022-09-01 DIAGNOSIS — E119 Type 2 diabetes mellitus without complications: Secondary | ICD-10-CM | POA: Diagnosis not present

## 2022-09-01 DIAGNOSIS — Z1389 Encounter for screening for other disorder: Secondary | ICD-10-CM | POA: Diagnosis not present

## 2022-09-21 DIAGNOSIS — Z419 Encounter for procedure for purposes other than remedying health state, unspecified: Secondary | ICD-10-CM | POA: Diagnosis not present

## 2022-09-25 ENCOUNTER — Other Ambulatory Visit: Payer: Self-pay

## 2022-09-25 ENCOUNTER — Emergency Department: Payer: Medicaid Other

## 2022-09-25 DIAGNOSIS — R42 Dizziness and giddiness: Secondary | ICD-10-CM | POA: Insufficient documentation

## 2022-09-25 DIAGNOSIS — R079 Chest pain, unspecified: Secondary | ICD-10-CM | POA: Diagnosis not present

## 2022-09-25 DIAGNOSIS — Z5321 Procedure and treatment not carried out due to patient leaving prior to being seen by health care provider: Secondary | ICD-10-CM | POA: Insufficient documentation

## 2022-09-25 DIAGNOSIS — R0789 Other chest pain: Secondary | ICD-10-CM | POA: Diagnosis not present

## 2022-09-25 DIAGNOSIS — R0602 Shortness of breath: Secondary | ICD-10-CM | POA: Diagnosis not present

## 2022-09-25 LAB — CBC
HCT: 42.7 % (ref 39.0–52.0)
Hemoglobin: 12.9 g/dL — ABNORMAL LOW (ref 13.0–17.0)
MCH: 25.2 pg — ABNORMAL LOW (ref 26.0–34.0)
MCHC: 30.2 g/dL (ref 30.0–36.0)
MCV: 83.4 fL (ref 80.0–100.0)
Platelets: 286 10*3/uL (ref 150–400)
RBC: 5.12 MIL/uL (ref 4.22–5.81)
RDW: 14.6 % (ref 11.5–15.5)
WBC: 10.5 10*3/uL (ref 4.0–10.5)
nRBC: 0 % (ref 0.0–0.2)

## 2022-09-25 LAB — BASIC METABOLIC PANEL
Anion gap: 9 (ref 5–15)
BUN: 9 mg/dL (ref 6–20)
CO2: 27 mmol/L (ref 22–32)
Calcium: 8.9 mg/dL (ref 8.9–10.3)
Chloride: 100 mmol/L (ref 98–111)
Creatinine, Ser: 0.81 mg/dL (ref 0.61–1.24)
GFR, Estimated: >60 mL/min (ref >60)
Glucose, Bld: 144 mg/dL — ABNORMAL HIGH (ref 70–99)
Potassium: 3.8 mmol/L (ref 3.5–5.1)
Sodium: 136 mmol/L (ref 135–145)

## 2022-09-25 LAB — TROPONIN I (HIGH SENSITIVITY): Troponin I (High Sensitivity): 11 ng/L (ref <18)

## 2022-09-25 NOTE — ED Triage Notes (Addendum)
Pt presents with a 2 day hx of CP, lightheadedness, and ShOB. Pain is described as a pressure and does not radiate. Pt has taken "heartburn medicine" without relief.

## 2022-09-26 ENCOUNTER — Emergency Department
Admission: EM | Admit: 2022-09-26 | Discharge: 2022-09-26 | Discharge status: Against Medical Advice | Payer: Medicaid Other | Attending: Emergency Medicine | Admitting: Emergency Medicine

## 2022-09-26 HISTORY — DX: Type 2 diabetes mellitus without complications: E11.9

## 2022-09-26 LAB — HEPATIC FUNCTION PANEL
ALT: 23 U/L (ref 0–44)
AST: 20 U/L (ref 15–41)
Albumin: 3.9 g/dL (ref 3.5–5.0)
Alkaline Phosphatase: 38 U/L (ref 38–126)
Bilirubin, Direct: <0.1 mg/dL (ref 0.0–0.2)
Total Bilirubin: 0.6 mg/dL (ref 0.3–1.2)
Total Protein: 7.5 g/dL (ref 6.5–8.1)

## 2022-09-26 LAB — LIPASE, BLOOD: Lipase: 36 U/L (ref 11–51)

## 2022-09-26 NOTE — ED Notes (Signed)
No answer when called several times for repeat labs and room

## 2022-10-13 DIAGNOSIS — Z1331 Encounter for screening for depression: Secondary | ICD-10-CM | POA: Diagnosis not present

## 2022-10-13 DIAGNOSIS — Z712 Person consulting for explanation of examination or test findings: Secondary | ICD-10-CM | POA: Diagnosis not present

## 2022-10-13 DIAGNOSIS — Z1389 Encounter for screening for other disorder: Secondary | ICD-10-CM | POA: Diagnosis not present

## 2022-10-13 DIAGNOSIS — E119 Type 2 diabetes mellitus without complications: Secondary | ICD-10-CM | POA: Diagnosis not present

## 2022-10-21 DIAGNOSIS — G4733 Obstructive sleep apnea (adult) (pediatric): Secondary | ICD-10-CM | POA: Diagnosis not present

## 2022-10-22 DIAGNOSIS — Z419 Encounter for procedure for purposes other than remedying health state, unspecified: Secondary | ICD-10-CM | POA: Diagnosis not present

## 2022-11-10 DIAGNOSIS — G4733 Obstructive sleep apnea (adult) (pediatric): Secondary | ICD-10-CM | POA: Diagnosis not present

## 2022-11-17 DIAGNOSIS — E119 Type 2 diabetes mellitus without complications: Secondary | ICD-10-CM | POA: Diagnosis not present

## 2022-11-17 DIAGNOSIS — Z712 Person consulting for explanation of examination or test findings: Secondary | ICD-10-CM | POA: Diagnosis not present

## 2022-11-17 DIAGNOSIS — Z1389 Encounter for screening for other disorder: Secondary | ICD-10-CM | POA: Diagnosis not present

## 2022-11-21 DIAGNOSIS — Z419 Encounter for procedure for purposes other than remedying health state, unspecified: Secondary | ICD-10-CM | POA: Diagnosis not present

## 2022-12-22 DIAGNOSIS — Z419 Encounter for procedure for purposes other than remedying health state, unspecified: Secondary | ICD-10-CM | POA: Diagnosis not present

## 2023-01-19 DIAGNOSIS — G4733 Obstructive sleep apnea (adult) (pediatric): Secondary | ICD-10-CM | POA: Diagnosis not present

## 2023-01-22 DIAGNOSIS — Z419 Encounter for procedure for purposes other than remedying health state, unspecified: Secondary | ICD-10-CM | POA: Diagnosis not present

## 2023-02-08 DIAGNOSIS — G4733 Obstructive sleep apnea (adult) (pediatric): Secondary | ICD-10-CM | POA: Diagnosis not present

## 2023-02-10 DIAGNOSIS — Z1389 Encounter for screening for other disorder: Secondary | ICD-10-CM | POA: Diagnosis not present

## 2023-02-10 DIAGNOSIS — E119 Type 2 diabetes mellitus without complications: Secondary | ICD-10-CM | POA: Diagnosis not present

## 2023-02-10 DIAGNOSIS — F5221 Male erectile disorder: Secondary | ICD-10-CM | POA: Diagnosis not present

## 2023-02-10 DIAGNOSIS — Z712 Person consulting for explanation of examination or test findings: Secondary | ICD-10-CM | POA: Diagnosis not present

## 2023-02-11 ENCOUNTER — Emergency Department
Admission: EM | Admit: 2023-02-11 | Discharge: 2023-02-11 | Disposition: A | Discharge status: Home / Self Care | Payer: Medicaid Other | Attending: Emergency Medicine | Admitting: Emergency Medicine

## 2023-02-11 ENCOUNTER — Emergency Department: Payer: Medicaid Other

## 2023-02-11 ENCOUNTER — Other Ambulatory Visit: Payer: Self-pay

## 2023-02-11 DIAGNOSIS — R079 Chest pain, unspecified: Secondary | ICD-10-CM | POA: Diagnosis not present

## 2023-02-11 DIAGNOSIS — R0789 Other chest pain: Secondary | ICD-10-CM | POA: Insufficient documentation

## 2023-02-11 LAB — TROPONIN I (HIGH SENSITIVITY): Troponin I (High Sensitivity): 7 ng/L (ref <18)

## 2023-02-11 LAB — BASIC METABOLIC PANEL
Anion gap: 8 (ref 5–15)
BUN: 6 mg/dL (ref 6–20)
CO2: 26 mmol/L (ref 22–32)
Calcium: 8.7 mg/dL — ABNORMAL LOW (ref 8.9–10.3)
Chloride: 103 mmol/L (ref 98–111)
Creatinine, Ser: 0.76 mg/dL (ref 0.61–1.24)
GFR, Estimated: >60 mL/min (ref >60)
Glucose, Bld: 99 mg/dL (ref 70–99)
Potassium: 3.6 mmol/L (ref 3.5–5.1)
Sodium: 137 mmol/L (ref 135–145)

## 2023-02-11 LAB — CBC
HCT: 41.2 % (ref 39.0–52.0)
Hemoglobin: 12.6 g/dL — ABNORMAL LOW (ref 13.0–17.0)
MCH: 24.9 pg — ABNORMAL LOW (ref 26.0–34.0)
MCHC: 30.6 g/dL (ref 30.0–36.0)
MCV: 81.3 fL (ref 80.0–100.0)
Platelets: 274 10*3/uL (ref 150–400)
RBC: 5.07 MIL/uL (ref 4.22–5.81)
RDW: 15.9 % — ABNORMAL HIGH (ref 11.5–15.5)
WBC: 8.8 10*3/uL (ref 4.0–10.5)
nRBC: 0 % (ref 0.0–0.2)

## 2023-02-11 MED ORDER — ALUM & MAG HYDROXIDE-SIMETH 200-200-20 MG/5ML PO SUSP
30.0000 mL | Freq: Once | ORAL | Status: AC
  Administered 2023-02-11: 30 mL via ORAL
  Filled 2023-02-11: qty 30

## 2023-02-11 MED ORDER — LIDOCAINE VISCOUS HCL 2 % MT SOLN
15.0000 mL | Freq: Once | OROMUCOSAL | Status: AC
  Administered 2023-02-11: 15 mL via ORAL
  Filled 2023-02-11: qty 15

## 2023-02-11 MED ORDER — PANTOPRAZOLE SODIUM 40 MG PO TBEC: 40.0000 mg | DELAYED_RELEASE_TABLET | Freq: Every day | ORAL | 1 refills | Status: DC

## 2023-02-11 NOTE — ED Provider Notes (Signed)
[facility] Provider Note    Event Date/Time   First MD Initiated Contact with Patient 02/11/23 1107     (approximate)   History   Chest Pain   HPI  [name] is a 47 y.o. male who presents with complaints of chest tightness over the last 3 days.  He reports this has been constant.  He reports in the past he was told that he has acid reflux, and he wonders if this is related.  Denies a history of CAD.  No fevers chills or cough.  No lower extremity swelling     Physical Exam   Triage Vital Signs: ED Triage Vitals  Encounter Vitals Group     BP 02/11/23 1057 134/74     Systolic BP Percentile --      Diastolic BP Percentile --      Pulse Rate 02/11/23 1057 69     Resp 02/11/23 1057 16     Temp 02/11/23 1057 98.1 F (36.7 C)     Temp Source 02/11/23 1057 Oral     SpO2 02/11/23 1057 97 %     Weight 02/11/23 1054 (!) 165.1 kg (364 lb)     Height 02/11/23 1054 1.676 m (5\' 6" )     Head Circumference --      Peak Flow --      Pain Score 02/11/23 1055 7     Pain Loc --      Pain Education --      Exclude from Growth Chart --     Most recent vital signs: Vitals:   02/11/23 1057  BP: 134/74  Pulse: 69  Resp: 16  Temp: 98.1 F (36.7 C)  SpO2: 97%     General: Awake, no distress.  CV:  Good peripheral perfusion.  Resp:  Normal effort.  Clear to auscultation bilaterally Abd:  No distention.  Other:     ED Results / Procedures / Treatments   Labs (all labs ordered are listed, but only abnormal results are displayed) Labs Reviewed  BASIC METABOLIC PANEL - Abnormal; Notable for the following components:      Result Value   Calcium 8.7 (*)    All other components within normal limits  CBC - Abnormal; Notable for the following components:   Hemoglobin 12.6 (*)    MCH 24.9 (*)    RDW 15.9 (*)    All other components within normal limits  TROPONIN I (HIGH SENSITIVITY)  TROPONIN I (HIGH SENSITIVITY)     EKG  ED ECG  REPORT I, Jene Every, the attending physician, personally viewed and interpreted this ECG.  Date: 02/11/2023  Rhythm: normal sinus rhythm QRS Axis: normal Intervals: normal ST/T Wave abnormalities: normal Narrative Interpretation: no evidence of acute ischemia    RADIOLOGY X-ray viewed interpret by me, no acute abnormality    PROCEDURES:  Critical Care performed:   Procedures   MEDICATIONS ORDERED IN ED: Medications  alum & mag hydroxide-simeth (MAALOX/MYLANTA) 200-200-20 MG/5ML suspension 30 mL (30 mLs Oral Given 02/11/23 1145)    And  lidocaine (XYLOCAINE) 2 % viscous mouth solution 15 mL (15 mLs Oral Given 02/11/23 1146)     IMPRESSION / MDM / ASSESSMENT AND PLAN / ED COURSE  I reviewed the triage vital signs and the nursing notes. Patient's presentation is most consistent with acute presentation with potential threat to life or bodily function.  Patient with multiple comorbidities presents with complaints of chest pain.  Differential includes ACS, angina,  GERD, musculoskeletal chest pain  Not consistent with PE or dissection  Vital signs are reassuring today.  High sensitive troponin, EKG are reassuring, chest x-ray without pneumonia.  Trial of GI cocktail  Patient improved after GI cocktail, suspicious for possible esophagitis but given history will refer for outpatient follow-up with cardiology, no indication for admission at this time, return precautions discussed.      FINAL CLINICAL IMPRESSION(S) / ED DIAGNOSES   Final diagnoses:  Atypical chest pain     Rx / DC Orders   ED Discharge Orders          Ordered    Ambulatory referral to Cardiology       Comments: If you have not heard from the Cardiology office within the next 72 hours please call [phone].   02/11/23 1222    pantoprazole (PROTONIX) 40 MG tablet  Daily        02/11/23 1222             Note:  This document was prepared using Dragon voice recognition software and may  include unintentional dictation errors.   Jene Every, MD 02/11/23 1230

## 2023-02-11 NOTE — ED Triage Notes (Signed)
Pt to ED for central chest pain since 3 days ago, sharp, non radiating. Denies SOB. Pt in NAD, skin dry, respirations unlabored.

## 2023-02-21 DIAGNOSIS — G4733 Obstructive sleep apnea (adult) (pediatric): Secondary | ICD-10-CM | POA: Diagnosis not present

## 2023-02-21 DIAGNOSIS — Z419 Encounter for procedure for purposes other than remedying health state, unspecified: Secondary | ICD-10-CM | POA: Diagnosis not present

## 2023-03-10 DIAGNOSIS — G4733 Obstructive sleep apnea (adult) (pediatric): Secondary | ICD-10-CM | POA: Diagnosis not present

## 2023-03-20 ENCOUNTER — Encounter: Payer: Self-pay | Admitting: *Deleted

## 2023-03-20 ENCOUNTER — Telehealth: Payer: Self-pay | Admitting: *Deleted

## 2023-03-20 NOTE — Telephone Encounter (Signed)
 LMOVM to verify card hx

## 2023-03-24 DIAGNOSIS — E119 Type 2 diabetes mellitus without complications: Secondary | ICD-10-CM | POA: Diagnosis not present

## 2023-03-24 DIAGNOSIS — Z419 Encounter for procedure for purposes other than remedying health state, unspecified: Secondary | ICD-10-CM | POA: Diagnosis not present

## 2023-03-24 DIAGNOSIS — Z23 Encounter for immunization: Secondary | ICD-10-CM | POA: Diagnosis not present

## 2023-03-24 DIAGNOSIS — G4733 Obstructive sleep apnea (adult) (pediatric): Secondary | ICD-10-CM | POA: Diagnosis not present

## 2023-03-27 ENCOUNTER — Ambulatory Visit: Payer: Medicaid Other | Admitting: Cardiology

## 2023-04-10 DIAGNOSIS — G4733 Obstructive sleep apnea (adult) (pediatric): Secondary | ICD-10-CM | POA: Diagnosis not present

## 2023-04-23 DIAGNOSIS — Z419 Encounter for procedure for purposes other than remedying health state, unspecified: Secondary | ICD-10-CM | POA: Diagnosis not present

## 2023-04-23 DIAGNOSIS — G4733 Obstructive sleep apnea (adult) (pediatric): Secondary | ICD-10-CM | POA: Diagnosis not present

## 2023-05-10 DIAGNOSIS — G4733 Obstructive sleep apnea (adult) (pediatric): Secondary | ICD-10-CM | POA: Diagnosis not present

## 2023-05-24 DIAGNOSIS — Z419 Encounter for procedure for purposes other than remedying health state, unspecified: Secondary | ICD-10-CM | POA: Diagnosis not present

## 2023-05-24 DIAGNOSIS — G4733 Obstructive sleep apnea (adult) (pediatric): Secondary | ICD-10-CM | POA: Diagnosis not present

## 2023-05-26 DIAGNOSIS — F4321 Adjustment disorder with depressed mood: Secondary | ICD-10-CM | POA: Diagnosis not present

## 2023-05-26 DIAGNOSIS — E119 Type 2 diabetes mellitus without complications: Secondary | ICD-10-CM | POA: Diagnosis not present

## 2023-05-26 DIAGNOSIS — Z1331 Encounter for screening for depression: Secondary | ICD-10-CM | POA: Diagnosis not present

## 2023-05-26 DIAGNOSIS — Z712 Person consulting for explanation of examination or test findings: Secondary | ICD-10-CM | POA: Diagnosis not present

## 2023-05-26 DIAGNOSIS — Z1389 Encounter for screening for other disorder: Secondary | ICD-10-CM | POA: Diagnosis not present

## 2023-06-02 NOTE — Progress Notes (Signed)
 [facility] [address] [place], [place] [address]  Pulmonary Sleep Medicine   Office Visit Note  Patient Name: [name] DOB: [birth year] MRN [id number]    Chief Complaint: Obstructive Sleep Apnea visit  Brief History:  [name] is seen today for a follow up visit for CPAP@ 15 cmH2O. The patient has a 7 month history of sleep apnea. Patient is using PAP nightly.  The patient feels rested after sleeping with PAP.  The patient reports benefiting from PAP use. Reported sleepiness is  improved and the Epworth Sleepiness Score is 9 out of 24. The patient will take naps daily. The patient complains of the following: none.  The compliance download shows 87% compliance with an average use time of 5 hours 33 minutes. The AHI is 6.  The patient does not complain of limb movements disrupting sleep. The patient continues to require PAP therapy in order to eliminate sleep apnea. The patient inquires as to whether or not he is able to work. He reports feeling tired when he tries to be active.   General: (-) fever, (-) chills, (-) night sweat Nose and Sinuses: (-) nasal stuffiness or itchiness, (-) postnasal drip, (-) nosebleeds, (-) sinus trouble. Mouth and Throat: (-) sore throat, (-) hoarseness. Neck: (-) swollen glands, (-) enlarged thyroid , (-) neck pain. Respiratory: - cough, + shortness of breath, - wheezing. Neurologic: - numbness, - tingling. Psychiatric: - anxiety, - depression   Current Medication: Outpatient Encounter Medications as of 06/05/2023  Medication Sig   famotidine  (PEPCID ) 20 MG tablet Take 1 tablet (20 mg total) by mouth daily.   metFORMIN (GLUCOPHAGE-XR) 500 MG 24 hr tablet Take 500 mg by mouth daily.   pantoprazole  (PROTONIX ) 40 MG tablet Take 1 tablet (40 mg total) by mouth daily.   sucralfate  (CARAFATE ) 1 g tablet Take 1 tablet (1 g total) by mouth 4 (four) times daily. (Patient not taking: Reported on 10/06/2021)   No facility-administered  encounter medications on file as of 06/05/2023.    Surgical History: No past surgical history on file.  Medical History: Past Medical History:  Diagnosis Date   Diabetes mellitus without complication (HCC)    Former smoker    GERD (gastroesophageal reflux disease)     Family History: Non contributory to the present illness  Social History: Social History   Socioeconomic History   Marital status: Single    Spouse name: Not on file   Number of children: Not on file   Years of education: Not on file   Highest education level: Not on file  Occupational History   Not on file  Tobacco Use   Smoking status: Former    Current packs/day: 0.00    Average packs/day: 1.3 packs/day for 29.9 years (37.4 ttl pk-yrs)    Types: Cigarettes    Start date: 46    Quit date: 04/14/2019    Years since quitting: 4.1   Smokeless tobacco: Never  Vaping Use   Vaping status: Never Used  Substance and Sexual Activity   Alcohol use: Not Currently    Comment: rarely   Drug use: Never   Sexual activity: Not on file  Other Topics Concern   Not on file  Social History Narrative   Not on file   Social Drivers of Health   Financial Resource Strain: Not on file  Food Insecurity: Not on file  Transportation Needs: Not on file  Physical Activity: Not on file  Stress: Not on file  Social Connections: Not  on file  Intimate Partner Violence: Not on file    Vital Signs: Blood pressure 124/76, pulse 70, resp. rate 16, height 5' 6 (1.676 m), weight (!) 356 lb (161.5 kg), SpO2 97%. Body mass index is 57.46 kg/m.    Examination: General Appearance: The patient is well-developed, well-nourished, and in no distress. Neck Circumference: 55 cm Skin: Gross inspection of skin unremarkable. Head: normocephalic, no gross deformities. Eyes: no gross deformities noted. ENT: ears appear grossly normal Neurologic: Alert and oriented. No involuntary movements.  STOP BANG RISK ASSESSMENT S (snore)  Have you been told that you snore?     NO   T (tired) Are you often tired, fatigued, or sleepy during the day?   NO  O (obstruction) Do you stop breathing, choke, or gasp during sleep? NO   P (pressure) Do you have or are you being treated for high blood pressure? NO   B (BMI) Is your body index greater than 35 kg/m? YES   A (age) Are you 99 years old or older? NO   N (neck) Do you have a neck circumference greater than 16 inches?   YES   G (gender) Are you a male? YES   TOTAL STOP/BANG "YES" ANSWERS 3       A STOP-Bang score of 2 or less is considered low risk, and a score of 5 or more is high risk for having either moderate or severe OSA. For people who score 3 or 4, doctors may need to perform further assessment to determine how likely they are to have OSA.         EPWORTH SLEEPINESS SCALE:  Scale:  (0)= no chance of dozing; (1)= slight chance of dozing; (2)= moderate chance of dozing; (3)= high chance of dozing  Chance  Situtation    Sitting and reading: 1    Watching TV: 2    Sitting Inactive in public: 2    As a passenger in car: 2      Lying down to rest: 1    Sitting and talking: 0    Sitting quielty after lunch: 1    In a car, stopped in traffic: 0   TOTAL SCORE:   9 out of 24    SLEEP STUDIES:  PSG (09/2022) AHI 31/hr, REM AHI 51.2/hr, min SPO2 83% Titration (12/2022) CPAP@ 15 cmH2O   CPAP COMPLIANCE DATA:  Date Range: 03/04/2023-06/01/2023  Average Daily Use: 5 hours 33 minutes  Median Use: 5 hours 45 minutes  Compliance for > 4 Hours: 87%  AHI: 1.9 respiratory events per hour  Days Used: 89/90 days  Mask Leak: 69.3  95th Percentile Pressure: 15         LABS: No results found for this or any previous visit (from the past 2160 hours).  Radiology: DG Chest 2 View Result Date: 02/11/2023 CLINICAL DATA:  chest pain EXAM: CHEST - 2 VIEW COMPARISON:  Sep 25, 2022 FINDINGS: The cardiomediastinal silhouette is unchanged in  contour. No pleural effusion. No pneumothorax. No acute pleuroparenchymal abnormality. Visualized abdomen is unremarkable. Minimal degenerative changes of the visualized lumbar spine. IMPRESSION: No acute cardiopulmonary abnormality. Electronically Signed   By: Corean Salter M.D.   On: 02/11/2023 11:23    No results found.  No results found.    Assessment and Plan: Patient Active Problem List   Diagnosis Date Noted   Cellulitis and abscess of neck 10/06/2021   Morbid obesity with BMI of 60.0-69.9, adult (HCC) 11/01/2017   GERD (gastroesophageal  reflux disease) 11/01/2017   Chest pain with moderate risk for cardiac etiology 10/31/2017   Smoker 10/31/2017   1. OSA (obstructive sleep apnea) (Primary) The patient does tolerate PAP and reports  benefit from PAP use. His apnea is well controlled. He was concerned that the apnea makes him unable to work. I explained that the apnea is currently well controlled and should not interfere with him working. He will discuss his concerns with his cardiologist that he will be seeing later this month. The patient was reminded how to clean equipment and advised to replace supplies routinely. The patient was also counselled on weight loss. The compliance is good. The AHI is 1.9.   OSA on cpap- controlled. Continue with good  compliance with pap. CPAP continues to be medically necessary to treat this patient's OSA. F/u 56m  2. CPAP use counseling CPAP Counseling: had a lengthy discussion with the patient regarding the importance of PAP therapy in management of the sleep apnea. Patient appears to understand the risk factor reduction and also understands the risks associated with untreated sleep apnea. Patient will try to make a good faith effort to remain compliant with therapy. Also instructed the patient on proper cleaning of the device including the water must be changed daily if possible and use of distilled water is preferred. Patient understands that  the machine should be regularly cleaned with appropriate recommended cleaning solutions that do not damage the PAP machine for example given white vinegar and water rinses. Other methods such as ozone treatment may not be as good as these simple methods to achieve cleaning.   3. Morbid obesity (HCC) Obesity Counseling: Had a lengthy discussion regarding patients BMI and weight issues. Patient was instructed on portion control as well as increased activity. Also discussed caloric restrictions with trying to maintain intake less than 2000 Kcal. Discussions were made in accordance with the 5As of weight management. Simple actions such as not eating late and if able to, taking a walk is suggested.     General Counseling: I have discussed the findings of the evaluation and examination with [name].  I have also discussed any further diagnostic evaluation thatmay be needed or ordered today. [name] verbalizes understanding of the findings of todays visit. We also reviewed his medications today and discussed drug interactions and side effects including but not limited excessive drowsiness and altered mental states. We also discussed that there is always a risk not just to him but also people around him. he has been encouraged to call the office with any questions or concerns that should arise related to todays visit.  No orders of the defined types were placed in this encounter.       I have personally obtained a history, examined the patient, evaluated laboratory and imaging results, formulated the assessment and plan and placed orders. This patient was seen today by Lauraine Lay, PA-C in collaboration with Dr. Elfreda Bathe.   Elfreda DELENA Bathe, MD [facility] Diplomate ABMS Pulmonary Critical Care Medicine and Sleep Medicine

## 2023-06-05 ENCOUNTER — Ambulatory Visit (INDEPENDENT_AMBULATORY_CARE_PROVIDER_SITE_OTHER): Payer: Medicaid Other | Admitting: Internal Medicine

## 2023-06-05 VITALS — BP 124/76 | HR 70 | Resp 16 | Ht 66.0 in | Wt 356.0 lb

## 2023-06-05 DIAGNOSIS — Z7189 Other specified counseling: Secondary | ICD-10-CM

## 2023-06-05 DIAGNOSIS — G4733 Obstructive sleep apnea (adult) (pediatric): Secondary | ICD-10-CM | POA: Diagnosis not present

## 2023-06-05 NOTE — Patient Instructions (Signed)

## 2023-06-09 ENCOUNTER — Encounter: Payer: Self-pay | Admitting: Cardiology

## 2023-06-09 ENCOUNTER — Ambulatory Visit: Payer: Medicaid Other | Attending: Cardiology | Admitting: Cardiology

## 2023-06-09 VITALS — BP 120/76 | Ht 66.0 in | Wt 359.0 lb

## 2023-06-09 DIAGNOSIS — Z6841 Body Mass Index (BMI) 40.0 and over, adult: Secondary | ICD-10-CM

## 2023-06-09 DIAGNOSIS — R072 Precordial pain: Secondary | ICD-10-CM

## 2023-06-09 DIAGNOSIS — I10 Essential (primary) hypertension: Secondary | ICD-10-CM

## 2023-06-09 MED ORDER — SUCRALFATE 1 G PO TABS: 1.0000 g | ORAL_TABLET | Freq: Four times a day (QID) | ORAL | 0 refills | Status: DC

## 2023-06-09 MED ORDER — PANTOPRAZOLE SODIUM 40 MG PO TBEC: 40.0000 mg | DELAYED_RELEASE_TABLET | Freq: Two times a day (BID) | ORAL | 0 refills | Status: AC

## 2023-06-09 NOTE — Progress Notes (Signed)
Cardiology Office Note:    Date:  06/09/2023   ID:  [facility] [place], DOB [birth year], MRN [id number]  PCP:  [facility] Providers Cardiologist:  Debbe Odea, MD     Referring MD: [facility]   Chief Complaint  Patient presents with   Follow-up    Hospital follow up - Patient reports SOB and chest pain. Meds reviewed verbally with patient.     History of Present Illness:    [name] is a 48 y.o. male with a hx of hypertension, diabetes, former smoker, OSA on CPAP, morbid obesity presenting with chest pain.  Presented to the ED about 3 months ago on 02/11/2023 due to symptoms of chest pain.  Workup with EKG and troponins were unrevealing.  Symptoms improved after GI cocktail, referral to cardiology placed.  Started on Protonix upon discharge.  Was also given Carafate on discharge, not taking as prescribed.  Symptoms of chest pain is not associated with exertion, ongoing over 5 months.  Eating tomato based foods induces or makes symptoms worse.  Denies any trauma to the chest.  Compliant with CPAP mask as prescribed.    Past Medical History:  Diagnosis Date   Diabetes mellitus without complication (HCC)    Former smoker    GERD (gastroesophageal reflux disease)     History reviewed. No pertinent surgical history.  Current Medications: Current Meds  Medication Sig   aspirin EC 81 MG tablet Take 81 mg by mouth daily. Swallow whole.   famotidine (PEPCID) 20 MG tablet Take 1 tablet (20 mg total) by mouth daily.   lisinopril (ZESTRIL) 5 MG tablet Take 5 mg by mouth daily.   metFORMIN (GLUCOPHAGE-XR) 500 MG 24 hr tablet Take 500 mg by mouth daily.   tadalafil (CIALIS) 10 MG tablet Take 10 mg by mouth as needed.   TRULICITY 0.75 MG/0.5ML SOAJ Inject into the skin.   [DISCONTINUED] pantoprazole (PROTONIX) 40 MG tablet Take 1 tablet (40 mg total) by mouth daily.     Allergies:   Patient has  no known allergies.   Social History   Socioeconomic History   Marital status: Single    Spouse name: Not on file   Number of children: Not on file   Years of education: Not on file   Highest education level: Not on file  Occupational History   Not on file  Tobacco Use   Smoking status: Former    Current packs/day: 0.00    Average packs/day: 1.3 packs/day for 29.9 years (37.4 ttl pk-yrs)    Types: Cigarettes    Start date: 36    Quit date: 04/14/2019    Years since quitting: 4.1   Smokeless tobacco: Never  Vaping Use   Vaping status: Never Used  Substance and Sexual Activity   Alcohol use: Not Currently    Comment: rarely   Drug use: Never   Sexual activity: Not on file  Other Topics Concern   Not on file  Social History Narrative   Not on file   Social Drivers of Health   Financial Resource Strain: Not on file  Food Insecurity: Not on file  Transportation Needs: Not on file  Physical Activity: Not on file  Stress: Not on file  Social Connections: Not on file     Family History: The patient's family history includes Heart attack in his mother; Heart disease in his mother; Hypertension in his mother.  ROS:   Please  see the history of present illness.     All other systems reviewed and are negative.  EKGs/Labs/Other Studies Reviewed:    The following studies were reviewed today:  EKG Interpretation Date/Time:  Friday June 09 2023 09:28:19 EST Ventricular Rate:  83 PR Interval:  178 QRS Duration:  92 QT Interval:  376 QTC Calculation: 441 R Axis:   119  Text Interpretation: Normal sinus rhythm Left posterior fascicular block Nonspecific T wave abnormality Confirmed by Debbe Odea ([id number]) on 06/09/2023 9:44:02 AM    Recent Labs: 09/25/2022: ALT 23 02/11/2023: BUN 6; Creatinine, Ser 0.76; Hemoglobin 12.6; Platelets 274; Potassium 3.6; Sodium 137  Recent Lipid Panel No results found for: "CHOL", "TRIG", "HDL", "CHOLHDL", "VLDL", "LDLCALC",  "LDLDIRECT"   Risk Assessment/Calculations:             Physical Exam:    VS:  BP 120/76 (BP Location: Left Arm, Patient Position: Sitting, Cuff Size: Normal)   Ht 5\' 6"  (1.676 m)   Wt (!) 359 lb (162.8 kg)   SpO2 96%   BMI 57.94 kg/m     Wt Readings from Last 3 Encounters:  06/09/23 (!) 359 lb (162.8 kg)  06/05/23 (!) 356 lb (161.5 kg)  02/11/23 (!) 364 lb (165.1 kg)     GEN:  Well nourished, well developed in no acute distress HEENT: Normal NECK: No JVD; No carotid bruits CARDIAC: RRR, no murmurs, rubs, gallops RESPIRATORY:  Clear to auscultation without rales, wheezing or rhonchi  ABDOMEN: Soft, non-tender, non-distended MUSCULOSKELETAL:  No edema; No deformity  SKIN: Warm and dry NEUROLOGIC:  Alert and oriented x 3 PSYCHIATRIC:  Normal affect   ASSESSMENT:    1. Precordial pain   2. Primary hypertension   3. Morbid obesity (HCC)    PLAN:    In order of problems listed above:  Chest pain, associated with certain foods suggesting GI/GERD.  Morbid obesity, intra abdominal distention/pressure increases esophageal acid exposure.  Increase Protonix to 40 mg twice daily, advised to take Carafate as prescribed.  Get echocardiogram.  Consider PET scan if symptoms persist despite management of GERD. Hypertension, BP controlled.  Continue lisinopril 5 mg daily. Morbid obesity, low-calorie diet, weight loss advised.  Advised to talk to PCP regarding switching Trulicity to [employer] or Mounjaro.  Follow-up after echo.     Medication Adjustments/Labs and Tests Ordered: Current medicines are reviewed at length with the patient today.  Concerns regarding medicines are outlined above.  Orders Placed This Encounter  Procedures   EKG 12-Lead   ECHOCARDIOGRAM COMPLETE   Meds ordered this encounter  Medications   sucralfate (CARAFATE) 1 g tablet    Sig: Take 1 tablet (1 g total) by mouth 4 (four) times daily.    Dispense:  60 tablet    Refill:  0   pantoprazole (PROTONIX)  40 MG tablet    Sig: Take 1 tablet (40 mg total) by mouth 2 (two) times daily.    Dispense:  180 tablet    Refill:  0    Patient Instructions  Medication Instructions:   INCREASE Protonix - Take one tablet ( 40mg ) by mouth twice a day RESTART Carafate - Take one tablet ( 1g) by mouth 4 times a day.   *If you need a refill on your cardiac medications before your next appointment, please call your pharmacy*   Lab Work:  None Ordered  If you have labs (blood work) drawn today and your tests are completely normal, you will receive your results only  by: MyChart Message (if you have MyChart) OR A paper copy in the mail If you have any lab test that is abnormal or we need to change your treatment, we will call you to review the results.   Testing/Procedures:  Your physician has requested that you have an echocardiogram. Echocardiography is a painless test that uses sound waves to create images of your heart. It provides your doctor with information about the size and shape of your heart and how well your heart's chambers and valves are working. This procedure takes approximately one hour. There are no restrictions for this procedure. Please do NOT wear cologne, perfume, aftershave, or lotions (deodorant is allowed). Please arrive 15 minutes prior to your appointment time.  Please note: We ask at that you not bring children with you during ultrasound (echo/ vascular) testing. Due to room size and safety concerns, children are not allowed in the ultrasound rooms during exams. Our front office staff cannot provide observation of children in our lobby area while testing is being conducted. An adult accompanying a patient to their appointment will only be allowed in the ultrasound room at the discretion of the ultrasound technician under special circumstances. We apologize for any inconvenience.    Follow-Up: At [facility], you and your health needs are our priority.  As part of  our continuing mission to provide you with exceptional heart care, we have created designated Provider Care Teams.  These Care Teams include your primary Cardiologist (physician) and Advanced Practice Providers (APPs -  Physician Assistants and Nurse Practitioners) who all work together to provide you with the care you need, when you need it.  We recommend signing up for the patient portal called "MyChart".  Sign up information is provided on this After Visit Summary.  MyChart is used to connect with patients for Virtual Visits (Telemedicine).  Patients are able to view lab/test results, encounter notes, upcoming appointments, etc.  Non-urgent messages can be sent to your provider as well.   To learn more about what you can do with MyChart, go to [email or web address].    Your next appointment:    After Echocardiogram  Provider:   You may see Debbe Odea, MD or one of the following Advanced Practice Providers on your designated Care Team:   Nicolasa Ducking, NP Eula Listen, PA-C Cadence Fransico Michael, PA-C Charlsie Quest, NP Carlos Levering, NP     Signed, Debbe Odea, MD  06/09/2023 10:18 AM    [facility]

## 2023-06-09 NOTE — Patient Instructions (Signed)
Medication Instructions:   INCREASE Protonix - Take one tablet ( 40mg ) by mouth twice a day RESTART Carafate - Take one tablet ( 1g) by mouth 4 times a day.   *If you need a refill on your cardiac medications before your next appointment, please call your pharmacy*   Lab Work:  None Ordered  If you have labs (blood work) drawn today and your tests are completely normal, you will receive your results only by: MyChart Message (if you have MyChart) OR A paper copy in the mail If you have any lab test that is abnormal or we need to change your treatment, we will call you to review the results.   Testing/Procedures:  Your physician has requested that you have an echocardiogram. Echocardiography is a painless test that uses sound waves to create images of your heart. It provides your doctor with information about the size and shape of your heart and how well your heart's chambers and valves are working. This procedure takes approximately one hour. There are no restrictions for this procedure. Please do NOT wear cologne, perfume, aftershave, or lotions (deodorant is allowed). Please arrive 15 minutes prior to your appointment time.  Please note: We ask at that you not bring children with you during ultrasound (echo/ vascular) testing. Due to room size and safety concerns, children are not allowed in the ultrasound rooms during exams. Our front office staff cannot provide observation of children in our lobby area while testing is being conducted. An adult accompanying a patient to their appointment will only be allowed in the ultrasound room at the discretion of the ultrasound technician under special circumstances. We apologize for any inconvenience.    Follow-Up: At [facility], you and your health needs are our priority.  As part of our continuing mission to provide you with exceptional heart care, we have created designated Provider Care Teams.  These Care Teams include your  primary Cardiologist (physician) and Advanced Practice Providers (APPs -  Physician Assistants and Nurse Practitioners) who all work together to provide you with the care you need, when you need it.  We recommend signing up for the patient portal called "MyChart".  Sign up information is provided on this After Visit Summary.  MyChart is used to connect with patients for Virtual Visits (Telemedicine).  Patients are able to view lab/test results, encounter notes, upcoming appointments, etc.  Non-urgent messages can be sent to your provider as well.   To learn more about what you can do with MyChart, go to [email or web address].    Your next appointment:    After Echocardiogram  Provider:   You may see Debbe Odea, MD or one of the following Advanced Practice Providers on your designated Care Team:   Nicolasa Ducking, NP Eula Listen, PA-C Cadence Fransico Michael, PA-C Charlsie Quest, NP Carlos Levering, NP

## 2023-06-10 DIAGNOSIS — G4733 Obstructive sleep apnea (adult) (pediatric): Secondary | ICD-10-CM | POA: Diagnosis not present

## 2023-06-23 ENCOUNTER — Ambulatory Visit: Payer: Medicaid Other | Attending: Cardiology

## 2023-06-23 DIAGNOSIS — R072 Precordial pain: Secondary | ICD-10-CM | POA: Diagnosis not present

## 2023-06-23 LAB — ECHOCARDIOGRAM COMPLETE
AR max vel: 4.13 cm2
AV Area VTI: 4.2 cm2
AV Area mean vel: 3.97 cm2
AV Mean grad: 5 mm[Hg]
AV Peak grad: 10.2 mm[Hg]
Ao pk vel: 1.6 m/s
Area-P 1/2: 4.89 cm2
Calc EF: 54.7 %
S' Lateral: 3.3 cm
Single Plane A2C EF: 52.1 %
Single Plane A4C EF: 54.2 %

## 2023-06-23 NOTE — Progress Notes (Unsigned)
Cardiology Office Note    Date:  06/26/2023   ID:  [name] [name], [name] 03/12/1976, MRN [id number]  PCP:  [facility]  Cardiologist:  Debbe Odea, MD  Electrophysiologist:  None   Chief Complaint: Follow-up  History of Present Illness:   [name] is a 48 y.o. male with history of DM2 HTN, prior tobacco use, obesity, prior tobacco use, and OSA on CPAP who presents for follow-up of echo.  He was evaluated in 2019 by Dr. Mariah Milling for ED follow-up of chest pain with noted reassuring ED evaluation.  He was started on omeprazole with no further testing pursued at that time.  He was reevaluated by Dr. Azucena Cecil in 05/2023 after presenting to the ED in 01/2023 for chest pain with reassuring cardiac workup.  Symptoms improved with GI cocktail.  He was not taking prescribed Protonix or Carafate.  At time of cardiology evaluation on 06/09/2023 he reported a 66-month history of chest pain that was not associated with exertion.  Eating acidic foods made symptoms worse or brought them on.  It was recommended Protonix be titrated to 40 mg twice daily and for him to take Carafate as directed.  Subsequent echo on 06/23/2023 showed an EF of 55 to 60%, no regional wall motion abnormalities, mild LVH, normal LV diastolic function parameters, and normal RV systolic function and ventricular cavity size.  He comes in accompanied by his girlfriend today.  He is without symptoms of angina or cardiac decompensation.  He continues to note a burning sensation when he eats acidic foods such as tomato or tomato sauces.  He denies any exertional angina.  He has started Protonix and reports some improvement, though not resolution of his burning sensation when eating acidic foods.  No dizziness, presyncope, or syncope.  Has not yet started Carafate.   Labs independently reviewed: 01/2023 - Hgb 12.6, PLT 274, potassium 3.6, BUN 6, serum creatinine 0.76 09/2022 - albumin 3.9,  AST/ALT normal  Past Medical History:  Diagnosis Date   Diabetes mellitus without complication (HCC)    Former smoker    GERD (gastroesophageal reflux disease)     History reviewed. No pertinent surgical history.  Current Medications: Current Meds  Medication Sig   aspirin EC 81 MG tablet Take 81 mg by mouth daily. Swallow whole.   lisinopril (ZESTRIL) 5 MG tablet Take 5 mg by mouth daily.   metFORMIN (GLUCOPHAGE-XR) 500 MG 24 hr tablet Take 500 mg by mouth daily.   pantoprazole (PROTONIX) 40 MG tablet Take 1 tablet (40 mg total) by mouth 2 (two) times daily.   tadalafil (CIALIS) 10 MG tablet Take 10 mg by mouth as needed.   TRULICITY 0.75 MG/0.5ML SOAJ Inject into the skin.    Allergies:   Patient has no known allergies.   Social History   Socioeconomic History   Marital status: Single    Spouse name: Not on file   Number of children: Not on file   Years of education: Not on file   Highest education level: Not on file  Occupational History   Not on file  Tobacco Use   Smoking status: Former    Current packs/day: 0.00    Average packs/day: 1.3 packs/day for 29.9 years (37.4 ttl pk-yrs)    Types: Cigarettes    Start date: 76    Quit date: 04/14/2019    Years since quitting: 4.2   Smokeless tobacco: Never  Vaping Use   Vaping status: Never Used  Substance and Sexual Activity   Alcohol use: Not Currently    Comment: rarely   Drug use: Never   Sexual activity: Not on file  Other Topics Concern   Not on file  Social History Narrative   Not on file   Social Drivers of Health   Financial Resource Strain: Not on file  Food Insecurity: Not on file  Transportation Needs: Not on file  Physical Activity: Not on file  Stress: Not on file  Social Connections: Not on file     Family History:  The patient's family history includes Heart attack in his mother; Heart disease in his mother; Hypertension in his mother.  ROS:   12-point review of systems is negative  unless otherwise noted in the HPI.   EKGs/Labs/Other Studies Reviewed:    Studies reviewed were summarized above. The additional studies were reviewed today:  2D echo 06/23/2023: 1. Left ventricular ejection fraction, by estimation, is 55 to 60%. Left  ventricular ejection fraction by 2D MOD biplane is 54.7 %. The left  ventricle has normal function. The left ventricle has no regional wall  motion abnormalities. There is mild left  ventricular hypertrophy. Left ventricular diastolic parameters were  normal.   2. Right ventricular systolic function is normal. The right ventricular  size is normal.   3. The mitral valve is normal in structure. No evidence of mitral valve  regurgitation.   4. The aortic valve is tricuspid. Aortic valve regurgitation is not  visualized.   5. The pulmonic valve was abnormal.    EKG:  EKG is not ordered today.    Recent Labs: 09/25/2022: ALT 23 02/11/2023: BUN 6; Creatinine, Ser 0.76; Hemoglobin 12.6; Platelets 274; Potassium 3.6; Sodium 137  Recent Lipid Panel No results found for: "CHOL", "TRIG", "HDL", "CHOLHDL", "VLDL", "LDLCALC", "LDLDIRECT"  PHYSICAL EXAM:    VS:  BP 110/70 (BP Location: Left Arm, Patient Position: Sitting, Cuff Size: Normal)   Pulse 81   Ht 5\' 6"  (1.676 m)   Wt (!) 357 lb 3.2 oz (162 kg)   SpO2 97%   BMI 57.65 kg/m   BMI: Body mass index is 57.65 kg/m.  Physical Exam Vitals reviewed.  Constitutional:      Appearance: He is well-developed.  HENT:     Head: Normocephalic and atraumatic.  Eyes:     General:        Right eye: No discharge.        Left eye: No discharge.  Neck:     Comments: JVD difficult to assess secondary to body habitus. Cardiovascular:     Rate and Rhythm: Normal rate and regular rhythm.     Heart sounds: Normal heart sounds, S1 normal and S2 normal. Heart sounds not distant. No midsystolic click and no opening snap. No murmur heard.    No friction rub.  Pulmonary:     Effort: Pulmonary effort  is normal. No respiratory distress.     Breath sounds: Normal breath sounds. No decreased breath sounds, wheezing, rhonchi or rales.  Chest:     Chest wall: No tenderness.  Abdominal:     General: There is no distension.  Musculoskeletal:     Cervical back: Normal range of motion.     Right lower leg: No edema.     Left lower leg: No edema.  Skin:    General: Skin is warm and dry.     Nails: There is no clubbing.  Neurological:     Mental Status: He is  alert and oriented to person, place, and time.  Psychiatric:        Speech: Speech normal.        Behavior: Behavior normal.        Thought Content: Thought content normal.        Judgment: Judgment normal.     Wt Readings from Last 3 Encounters:  06/26/23 (!) 357 lb 3.2 oz (162 kg)  06/09/23 (!) 359 lb (162.8 kg)  06/05/23 (!) 356 lb (161.5 kg)     ASSESSMENT & PLAN:   Atypical chest pain: Currently without symptoms of angina or cardiac decompensation.  Overall, chest pain is atypical in etiology and worse when eating acidic foods such as tomatoes or tomato sauces.  Pain has improved with the initiation of Protonix.  Symptoms are not exertional.  Echo with preserved LV systolic function, normal wall motion, and normal diastolic function.  However, he does have underlying coronary disease risk factors including prior history of tobacco use and morbid obesity.  Schedule myocardial PET/CT to evaluate for high risk ischemia.  If this is unrevealing, no further cardiac testing would be indicated at this time with recommendation for patient to follow-up with PCP for ongoing management of noncardiac chest pain.  HTN: Blood pressure is well-controlled in the office today.  Remains on lisinopril 5 mg with ongoing management per PCP.  Obesity with OSA: Weight loss is encouraged with heart healthy diet and regular exercise.  Continue CPAP.  GERD: Remains on Protonix and Carafate.  Follow-up with PCP.   Informed Consent   Shared Decision  Making/Informed Consent{  The risks [chest pain, shortness of breath, cardiac arrhythmias, dizziness, blood pressure fluctuations, myocardial infarction, stroke/transient ischemic attack, nausea, vomiting, allergic reaction, radiation exposure, metallic taste sensation and life-threatening complications (estimated to be 1 in 10,000)], benefits (risk stratification, diagnosing coronary artery disease, treatment guidance) and alternatives of a cardiac PET stress test were discussed in detail with Mr. Fahs and he agrees to proceed.        Disposition: F/u with Dr. Azucena Cecil or an APP as needed.   Medication Adjustments/Labs and Tests Ordered: Current medicines are reviewed at length with the patient today.  Concerns regarding medicines are outlined above. Medication changes, Labs and Tests ordered today are summarized above and listed in the Patient Instructions accessible in Encounters.   Signed, Eula Listen, PA-C 06/26/2023 10:23 AM     [facility] [place] [address] [place], [place] [address] [phone]

## 2023-06-24 DIAGNOSIS — G4733 Obstructive sleep apnea (adult) (pediatric): Secondary | ICD-10-CM | POA: Diagnosis not present

## 2023-06-24 DIAGNOSIS — Z419 Encounter for procedure for purposes other than remedying health state, unspecified: Secondary | ICD-10-CM | POA: Diagnosis not present

## 2023-06-26 ENCOUNTER — Encounter: Payer: Self-pay | Admitting: Physician Assistant

## 2023-06-26 ENCOUNTER — Ambulatory Visit: Payer: Medicaid Other | Attending: Physician Assistant | Admitting: Physician Assistant

## 2023-06-26 VITALS — BP 110/70 | HR 81 | Ht 66.0 in | Wt 357.2 lb

## 2023-06-26 DIAGNOSIS — K219 Gastro-esophageal reflux disease without esophagitis: Secondary | ICD-10-CM

## 2023-06-26 DIAGNOSIS — I10 Essential (primary) hypertension: Secondary | ICD-10-CM | POA: Diagnosis not present

## 2023-06-26 DIAGNOSIS — R072 Precordial pain: Secondary | ICD-10-CM | POA: Diagnosis not present

## 2023-06-26 MED ORDER — SUCRALFATE 1 G PO TABS: 1.0000 g | ORAL_TABLET | Freq: Four times a day (QID) | ORAL | 3 refills | Status: AC

## 2023-06-26 NOTE — Patient Instructions (Addendum)
Medication Instructions:  Your Physician recommend you continue on your current medication as directed.    *If you need a refill on your cardiac medications before your next appointment, please call your pharmacy*   Lab Work: None ordered at this time  If you have labs (blood work) drawn today and your tests are completely normal, you will receive your results only by: MyChart Message (if you have MyChart) OR A paper copy in the mail If you have any lab test that is abnormal or we need to change your treatment, we will call you to review the results.   Testing/Procedures:   Please report to Radiology at [facility] Main Entrance, medical mall, 30 mins prior to your test.  [address]  [place], [place]  How to Prepare for Your Cardiac PET/CT Stress Test:  Nothing to eat or drink, except water, 3 hours prior to arrival time.  NO caffeine/decaffeinated products, or chocolate 12 hours prior to arrival. (Please note decaffeinated beverages (teas/coffees) still contain caffeine).  If you have caffeine within 12 hours prior, the test will need to be rescheduled.  Medication instructions: Do not take erectile dysfunction medications for 72 hours prior to test (sildenafil, tadalafil)  You may take your remaining medications with water.  NO perfume, cologne or lotion on chest or abdomen area.  Total time is 1 to 2 hours; you may want to bring reading material for the waiting time.   In preparation for your appointment, medication and supplies will be purchased.  Appointment availability is limited, so if you need to cancel or reschedule, please call the Radiology Department Scheduler at [phone] 24 hours in advance to avoid a cancellation fee of $100.00  What to Expect When you Arrive:  Once you arrive and check in for your appointment, you will be taken to a preparation room within the Radiology Department.  A technologist or Nurse will obtain your  medical history, verify that you are correctly prepped for the exam, and explain the procedure.  Afterwards, an IV will be started in your arm and electrodes will be placed on your skin for EKG monitoring during the stress portion of the exam. Then you will be escorted to the PET/CT scanner.  There, staff will get you positioned on the scanner and obtain a blood pressure and EKG.  During the exam, you will continue to be connected to the EKG and blood pressure machines.  A small, safe amount of a radioactive tracer will be injected in your IV to obtain a series of pictures of your heart along with an injection of a stress agent.    After your Exam:  It is recommended that you eat a meal and drink a caffeinated beverage to counter act any effects of the stress agent.  Drink plenty of fluids for the remainder of the day and urinate frequently for the first couple of hours after the exam.  Your doctor will inform you of your test results within 7-10 business days.  For more information and frequently asked questions, please visit our website: [email or web address]  For questions about your test or how to prepare for your test, please call: Cardiac Imaging Nurse Navigators Office: [phone]    Follow-Up: At [facility], you and your health needs are our priority.  As part of our continuing mission to provide you with exceptional heart care, we have created designated Provider Care Teams.  These Care Teams include your primary Cardiologist (physician) and Advanced Practice Providers (  APPs -  Physician Assistants and Nurse Practitioners) who all work together to provide you with the care you need, when you need it.   Your next appointment:   As needed

## 2023-07-11 DIAGNOSIS — G4733 Obstructive sleep apnea (adult) (pediatric): Secondary | ICD-10-CM | POA: Diagnosis not present

## 2023-07-22 DIAGNOSIS — G4733 Obstructive sleep apnea (adult) (pediatric): Secondary | ICD-10-CM | POA: Diagnosis not present

## 2023-07-22 DIAGNOSIS — Z419 Encounter for procedure for purposes other than remedying health state, unspecified: Secondary | ICD-10-CM | POA: Diagnosis not present

## 2023-07-27 DIAGNOSIS — Z1159 Encounter for screening for other viral diseases: Secondary | ICD-10-CM | POA: Diagnosis not present

## 2023-07-27 DIAGNOSIS — Z20822 Contact with and (suspected) exposure to covid-19: Secondary | ICD-10-CM | POA: Diagnosis not present

## 2023-07-27 DIAGNOSIS — U071 COVID-19: Secondary | ICD-10-CM | POA: Diagnosis not present

## 2023-07-27 DIAGNOSIS — Z712 Person consulting for explanation of examination or test findings: Secondary | ICD-10-CM | POA: Diagnosis not present

## 2023-07-27 DIAGNOSIS — Z1389 Encounter for screening for other disorder: Secondary | ICD-10-CM | POA: Diagnosis not present

## 2023-08-08 DIAGNOSIS — G4733 Obstructive sleep apnea (adult) (pediatric): Secondary | ICD-10-CM | POA: Diagnosis not present

## 2023-08-15 ENCOUNTER — Encounter (HOSPITAL_COMMUNITY): Payer: Self-pay

## 2023-08-16 ENCOUNTER — Telehealth (HOSPITAL_COMMUNITY): Payer: Self-pay | Admitting: Emergency Medicine

## 2023-08-16 NOTE — Telephone Encounter (Signed)
 Called patient to move appt due to ins pending   New appt for 4/3  Rockwell Alexandria RN Navigator Cardiac Imaging [facility] [phone] Office  [phone] Cell

## 2023-08-17 ENCOUNTER — Ambulatory Visit: Payer: Medicaid Other

## 2023-08-22 ENCOUNTER — Other Ambulatory Visit

## 2023-08-22 DIAGNOSIS — G4733 Obstructive sleep apnea (adult) (pediatric): Secondary | ICD-10-CM | POA: Diagnosis not present

## 2023-08-23 ENCOUNTER — Telehealth (HOSPITAL_COMMUNITY): Payer: Self-pay | Admitting: *Deleted

## 2023-08-23 NOTE — Telephone Encounter (Signed)
 Patient calling about his upcoming cardiac imaging study; pt verbalizes understanding of appt date/time, parking situation and where to check in, pre-test NPO status, and verified current allergies; name and call back number provided for further questions should they arise  Anthony Brick RN Navigator Cardiac Imaging [facility] [phone] office [phone] cell  Patient aware to avoid caffeine 12 hours prior to his cardiac PET scan.

## 2023-08-24 ENCOUNTER — Ambulatory Visit
Admission: RE | Admit: 2023-08-24 | Discharge: 2023-08-24 | Disposition: A | Discharge status: Home / Self Care | Source: Ambulatory Visit | Attending: Physician Assistant | Admitting: Physician Assistant

## 2023-08-24 DIAGNOSIS — R072 Precordial pain: Secondary | ICD-10-CM

## 2023-08-24 DIAGNOSIS — I700 Atherosclerosis of aorta: Secondary | ICD-10-CM | POA: Diagnosis not present

## 2023-08-24 MED ORDER — RUBIDIUM RB82 GENERATOR (RUBYFILL)
25.0000 | PACK | Freq: Once | INTRAVENOUS | Status: AC
  Administered 2023-08-24: 25.05 via INTRAVENOUS

## 2023-08-24 MED ORDER — REGADENOSON 0.4 MG/5ML IV SOLN
INTRAVENOUS | Status: AC
  Filled 2023-08-24: qty 5

## 2023-08-24 MED ORDER — REGADENOSON 0.4 MG/5ML IV SOLN
0.4000 mg | Freq: Once | INTRAVENOUS | Status: AC
  Administered 2023-08-24: 0.4 mg via INTRAVENOUS
  Filled 2023-08-24: qty 5

## 2023-08-24 MED ORDER — RUBIDIUM RB82 GENERATOR (RUBYFILL)
25.0000 | PACK | Freq: Once | INTRAVENOUS | Status: AC
  Administered 2023-08-24: 25.24 via INTRAVENOUS

## 2023-08-24 NOTE — Progress Notes (Signed)
 Patient presents for a cardiac PET stress test and tolerated procedure without incident. Patient maintained acceptable vital signs throughout the test and was offered caffeine after test.  Patient ambulated out of department with a steady gait.

## 2023-08-25 LAB — NM PET CT CARDIAC PERFUSION MULTI W/ABSOLUTE BLOODFLOW
LV dias vol: 175 mL (ref 62–150)
LV sys vol: 56 mL
MBFR: 2.89
Nuc Rest EF: 61 %
Nuc Stress EF: 68 %
Peak HR: 123 {beats}/min
Rest HR: 79 {beats}/min
Rest MBF: 1.03 ml/g/min
Rest Nuclear Isotope Dose: 25.2 mCi
ST Depression (mm): 0 mm
Stress MBF: 2.98 ml/g/min
Stress Nuclear Isotope Dose: 25.1 mCi
TID: 0.94

## 2023-09-02 DIAGNOSIS — Z419 Encounter for procedure for purposes other than remedying health state, unspecified: Secondary | ICD-10-CM | POA: Diagnosis not present

## 2023-09-08 DIAGNOSIS — G4733 Obstructive sleep apnea (adult) (pediatric): Secondary | ICD-10-CM | POA: Diagnosis not present

## 2023-09-21 DIAGNOSIS — G4733 Obstructive sleep apnea (adult) (pediatric): Secondary | ICD-10-CM | POA: Diagnosis not present

## 2023-10-02 DIAGNOSIS — Z419 Encounter for procedure for purposes other than remedying health state, unspecified: Secondary | ICD-10-CM | POA: Diagnosis not present

## 2023-10-08 DIAGNOSIS — G4733 Obstructive sleep apnea (adult) (pediatric): Secondary | ICD-10-CM | POA: Diagnosis not present

## 2023-10-22 DIAGNOSIS — G4733 Obstructive sleep apnea (adult) (pediatric): Secondary | ICD-10-CM | POA: Diagnosis not present

## 2023-11-02 DIAGNOSIS — Z419 Encounter for procedure for purposes other than remedying health state, unspecified: Secondary | ICD-10-CM | POA: Diagnosis not present

## 2023-11-08 DIAGNOSIS — G4733 Obstructive sleep apnea (adult) (pediatric): Secondary | ICD-10-CM | POA: Diagnosis not present

## 2023-12-02 DIAGNOSIS — Z419 Encounter for procedure for purposes other than remedying health state, unspecified: Secondary | ICD-10-CM | POA: Diagnosis not present

## 2023-12-08 DIAGNOSIS — G4733 Obstructive sleep apnea (adult) (pediatric): Secondary | ICD-10-CM | POA: Diagnosis not present

## 2023-12-11 ENCOUNTER — Ambulatory Visit (INDEPENDENT_AMBULATORY_CARE_PROVIDER_SITE_OTHER): Admitting: Internal Medicine

## 2023-12-11 VITALS — BP 153/80 | HR 88 | Resp 18 | Ht 67.0 in | Wt 359.0 lb

## 2023-12-11 DIAGNOSIS — G4733 Obstructive sleep apnea (adult) (pediatric): Secondary | ICD-10-CM | POA: Diagnosis not present

## 2023-12-11 DIAGNOSIS — Z7189 Other specified counseling: Secondary | ICD-10-CM | POA: Diagnosis not present

## 2023-12-11 NOTE — Patient Instructions (Signed)

## 2023-12-11 NOTE — Progress Notes (Signed)
 [facility] [address] [place], [place] [address]  Pulmonary Sleep Medicine   Office Visit Note  Patient Name: [name] DOB: Oct 19, 1975 MRN [id number]    Chief Complaint: Obstructive Sleep Apnea visit  Brief History:  Abbas is seen today for a follow up visit for CPAP@ 15 cmH2O. The patient has a 1 year history of sleep apnea. Patient is using PAP nightly.  The patient feels rested after sleeping with PAP.  The patient reports benefiting from PAP use. Reported sleepiness is  improved and the Epworth Sleepiness Score is 16 out of 24. The patient does take naps daily while watching TV. The patient complains of the following: some fatigue.  The compliance download shows 72% compliance with an average use time of 5 hours 5 minutes. The AHI is 2.5.  The patient occasionally complains of limb movements disrupting sleep. The patient continues to require PAP therapy in order to eliminate sleep apnea.   ROS  General: (-) fever, (-) chills, (-) night sweat Nose and Sinuses: (-) nasal stuffiness or itchiness, (-) postnasal drip, (-) nosebleeds, (-) sinus trouble. Mouth and Throat: (-) sore throat, (-) hoarseness. Neck: (-) swollen glands, (-) enlarged thyroid , (-) neck pain. Respiratory: + cough, - shortness of breath, - wheezing. Neurologic: - numbness, - tingling. Psychiatric: +anxiety, - depression   Current Medication: Outpatient Encounter Medications as of 12/11/2023  Medication Sig   aspirin EC 81 MG tablet Take 81 mg by mouth daily. Swallow whole.   lisinopril (ZESTRIL) 5 MG tablet Take 5 mg by mouth daily.   metFORMIN (GLUCOPHAGE-XR) 500 MG 24 hr tablet Take 500 mg by mouth daily.   pantoprazole  (PROTONIX ) 40 MG tablet Take 1 tablet (40 mg total) by mouth 2 (two) times daily.   sucralfate  (CARAFATE ) 1 g tablet Take 1 tablet (1 g total) by mouth 4 (four) times daily.   tadalafil (CIALIS) 10 MG tablet Take 10 mg by mouth as needed.   TRULICITY 0.75  MG/0.5ML SOAJ Inject into the skin.   No facility-administered encounter medications on file as of 12/11/2023.    Surgical History: No past surgical history on file.  Medical History: Past Medical History:  Diagnosis Date   Diabetes mellitus without complication (HCC)    Former smoker    GERD (gastroesophageal reflux disease)     Family History: Non contributory to the present illness  Social History: Social History   Socioeconomic History   Marital status: Single    Spouse name: Not on file   Number of children: Not on file   Years of education: Not on file   Highest education level: Not on file  Occupational History   Not on file  Tobacco Use   Smoking status: Former    Current packs/day: 0.00    Average packs/day: 1.3 packs/day for 29.9 years (37.4 ttl pk-yrs)    Types: Cigarettes    Start date: 11    Quit date: 04/14/2019    Years since quitting: 4.6   Smokeless tobacco: Never  Vaping Use   Vaping status: Never Used  Substance and Sexual Activity   Alcohol use: Not Currently    Comment: rarely   Drug use: Never   Sexual activity: Not on file  Other Topics Concern   Not on file  Social History Narrative   Not on file   Social Drivers of Health   Financial Resource Strain: Not on file  Food Insecurity: Not on file  Transportation Needs: Not on file  Physical Activity: Not on file  Stress: Not on file  Social Connections: Not on file  Intimate Partner Violence: Not on file    Vital Signs: Blood pressure (!) 153/80, pulse 88, resp. rate 18, height 5' 7 (1.702 m), weight (!) 359 lb (162.8 kg), SpO2 96%. Body mass index is 56.23 kg/m.    Examination: General Appearance: The patient is well-developed, well-nourished, and in no distress. Neck Circumference: 55 cm Skin: Gross inspection of skin unremarkable. Head: normocephalic, no gross deformities. Eyes: no gross deformities noted. ENT: ears appear grossly normal Neurologic: Alert and  oriented. No involuntary movements.  STOP BANG RISK ASSESSMENT S (snore) Have you been told that you snore?     NO   T (tired) Are you often tired, fatigued, or sleepy during the day?   NO  O (obstruction) Do you stop breathing, choke, or gasp during sleep? NO   P (pressure) Do you have or are you being treated for high blood pressure? YES   B (BMI) Is your body index greater than 35 kg/m? YES   A (age) Are you 50 years old or older? NO   N (neck) Do you have a neck circumference greater than 16 inches?   YES   G (gender) Are you a male? YES   TOTAL STOP/BANG "YES" ANSWERS 4       A STOP-Bang score of 2 or less is considered low risk, and a score of 5 or more is high risk for having either moderate or severe OSA. For people who score 3 or 4, doctors may need to perform further assessment to determine how likely they are to have OSA.         EPWORTH SLEEPINESS SCALE:  Scale:  (0)= no chance of dozing; (1)= slight chance of dozing; (2)= moderate chance of dozing; (3)= high chance of dozing  Chance  Situtation    Sitting and reading: 2    Watching TV: 2    Sitting Inactive in public: 3    As a passenger in car: 0      Lying down to rest: 3    Sitting and talking: 3    Sitting quielty after lunch: 2    In a car, stopped in traffic: 1   TOTAL SCORE:   16 out of 24    SLEEP STUDIES:  PSG (09/2022) AHI 31/hr, REM AHI 51.2/hr, min SPO2 83% Titration (12/2022) CPAP@ 15 cmH2O   CPAP COMPLIANCE DATA:  Date Range: 02/08/2023-12/07/2023  Average Daily Use: 5 hours 5 minutes  Median Use: 5 hours 5 minutes  Compliance for > 4 Hours: 72%  AHI: 2.5 respiratory events per hour  Days Used: 289/303 days  Mask Leak: 63  95th Percentile Pressure: 15         LABS: No results found for this or any previous visit (from the past 2160 hours).  Radiology: NM PET CT CARDIAC PERFUSION MULTI W/ABSOLUTE BLOODFLOW Result Date: 08/25/2023   LV perfusion is  normal. There is no evidence of ischemia. There is no evidence of infarction.   Rest left ventricular function is normal. Rest EF: 61%. Stress left ventricular function is normal. Stress EF: 68%. End diastolic cavity size is mildly enlarged.   Myocardial blood flow was computed to be 1.33ml/g/min at rest and 2.73ml/g/min at stress. Global myocardial blood flow reserve was 2.89 and was normal.   Coronary calcium was absent on the attenuation correction CT images.  Aortic atherosclerosis was noted   The study  is low risk.   Electronically signed by: Lonni Hanson, MD. CLINICAL DATA:  This over-read does not include interpretation of cardiac or coronary anatomy or pathology. No interpretation the PET data set. The cardiac PET-CT interpretation by the cardiologist is attached. COMPARISON:  None Available. FINDINGS: Brain: No acute intracranial hemorrhage. No focal mass lesion. No CT evidence of acute infarction. No midline shift or mass effect. No hydrocephalus. Basilar cisterns are patent. Vascular: No hyperdense vessel or unexpected calcification. Skull: Normal. Negative for fracture or focal lesion. Sinuses/Orbits: Paranasal sinuses and mastoid air cells are clear. Orbits are clear. Other: None. IMPRESSION: No significant extracardiac findings. Electronically Signed   By: Jackquline Boxer M.D.   On: 08/24/2023 16:39   No results found.  No results found.    Assessment and Plan: Patient Active Problem List   Diagnosis Date Noted   OSA (obstructive sleep apnea) 06/05/2023   CPAP use counseling 06/05/2023   Morbid obesity (HCC) 06/05/2023   Adjustment disorder with depressed mood 05/26/2023   Elevated blood-pressure reading, without diagnosis of hypertension 04/25/2022   Cellulitis and abscess of neck 10/06/2021   Type 2 diabetes mellitus without complications (HCC) 12/03/2019   Morbid obesity with BMI of 60.0-69.9, adult (HCC) 11/01/2017   GERD (gastroesophageal reflux disease) 11/01/2017   Chest  pain with moderate risk for cardiac etiology 10/31/2017   Smoker 10/31/2017   1. OSA (obstructive sleep apnea) (Primary) The patient does tolerate PAP and reports  benefit from PAP use.He has some panic attacks during the night when wearing his cpap. He is encouraged to work on mask acclimation. His apnea is controlled, although he has had a few nights with elevated AHI, that likely corresponds to mask leak The patient was reminded how to clean equipment and advised to replace supplies routinely. The patient was also counselled on weight loss. The compliance is fair. The AHI is 2.5.   OSA on cpap- controlled. Continue with excellent compliance with pap. CPAP continues to be medically necessary to treat this patient's OSA. F/u one year.    2. CPAP use counseling CPAP Counseling: had a lengthy discussion with the patient regarding the importance of PAP therapy in management of the sleep apnea. Patient appears to understand the risk factor reduction and also understands the risks associated with untreated sleep apnea. Patient will try to make a good faith effort to remain compliant with therapy. Also instructed the patient on proper cleaning of the device including the water must be changed daily if possible and use of distilled water is preferred. Patient understands that the machine should be regularly cleaned with appropriate recommended cleaning solutions that do not damage the PAP machine for example given white vinegar and water rinses. Other methods such as ozone treatment may not be as good as these simple methods to achieve cleaning.   3. Morbid obesity (HCC) Obesity Counseling: Had a lengthy discussion regarding patients BMI and weight issues. Patient was instructed on portion control as well as increased activity. Also discussed caloric restrictions with trying to maintain intake less than 2000 Kcal. Discussions were made in accordance with the 5As of weight management. Simple actions such as  not eating late and if able to, taking a walk is suggested.     General Counseling: I have discussed the findings of the evaluation and examination with Muhammadali.  I have also discussed any further diagnostic evaluation thatmay be needed or ordered today. Branndon verbalizes understanding of the findings of todays visit. We also reviewed his medications  today and discussed drug interactions and side effects including but not limited excessive drowsiness and altered mental states. We also discussed that there is always a risk not just to him but also people around him. he has been encouraged to call the office with any questions or concerns that should arise related to todays visit.  No orders of the defined types were placed in this encounter.       I have personally obtained a history, examined the patient, evaluated laboratory and imaging results, formulated the assessment and plan and placed orders. This patient was seen today by Lauraine Lay, PA-C in collaboration with Dr. Elfreda Bathe.   Elfreda DELENA Bathe, MD [facility] Diplomate ABMS Pulmonary Critical Care Medicine and Sleep Medicine

## 2023-12-28 DIAGNOSIS — Z1389 Encounter for screening for other disorder: Secondary | ICD-10-CM | POA: Diagnosis not present

## 2023-12-28 DIAGNOSIS — Z712 Person consulting for explanation of examination or test findings: Secondary | ICD-10-CM | POA: Diagnosis not present

## 2023-12-28 DIAGNOSIS — J309 Allergic rhinitis, unspecified: Secondary | ICD-10-CM | POA: Diagnosis not present

## 2023-12-28 DIAGNOSIS — F410 Panic disorder [episodic paroxysmal anxiety] without agoraphobia: Secondary | ICD-10-CM | POA: Diagnosis not present

## 2024-01-02 DIAGNOSIS — Z419 Encounter for procedure for purposes other than remedying health state, unspecified: Secondary | ICD-10-CM | POA: Diagnosis not present

## 2024-01-08 DIAGNOSIS — G4733 Obstructive sleep apnea (adult) (pediatric): Secondary | ICD-10-CM | POA: Diagnosis not present

## 2024-01-31 DIAGNOSIS — Z1331 Encounter for screening for depression: Secondary | ICD-10-CM | POA: Diagnosis not present

## 2024-01-31 DIAGNOSIS — E119 Type 2 diabetes mellitus without complications: Secondary | ICD-10-CM | POA: Diagnosis not present

## 2024-01-31 DIAGNOSIS — Z712 Person consulting for explanation of examination or test findings: Secondary | ICD-10-CM | POA: Diagnosis not present

## 2024-01-31 DIAGNOSIS — F410 Panic disorder [episodic paroxysmal anxiety] without agoraphobia: Secondary | ICD-10-CM | POA: Diagnosis not present

## 2024-01-31 DIAGNOSIS — Z1389 Encounter for screening for other disorder: Secondary | ICD-10-CM | POA: Diagnosis not present

## 2024-01-31 DIAGNOSIS — K219 Gastro-esophageal reflux disease without esophagitis: Secondary | ICD-10-CM | POA: Diagnosis not present

## 2024-02-02 DIAGNOSIS — Z419 Encounter for procedure for purposes other than remedying health state, unspecified: Secondary | ICD-10-CM | POA: Diagnosis not present

## 2024-02-08 DIAGNOSIS — G4733 Obstructive sleep apnea (adult) (pediatric): Secondary | ICD-10-CM | POA: Diagnosis not present

## 2024-03-03 DIAGNOSIS — Z419 Encounter for procedure for purposes other than remedying health state, unspecified: Secondary | ICD-10-CM | POA: Diagnosis not present

## 2024-05-09 DIAGNOSIS — G4733 Obstructive sleep apnea (adult) (pediatric): Secondary | ICD-10-CM | POA: Diagnosis not present

## 2024-06-09 ENCOUNTER — Emergency Department
Admission: EM | Admit: 2024-06-09 | Discharge: 2024-06-09 | Disposition: A | Discharge status: Home / Self Care | Attending: Emergency Medicine | Admitting: Emergency Medicine

## 2024-06-09 ENCOUNTER — Other Ambulatory Visit: Payer: Self-pay

## 2024-06-09 ENCOUNTER — Emergency Department

## 2024-06-09 DIAGNOSIS — R0789 Other chest pain: Secondary | ICD-10-CM | POA: Diagnosis not present

## 2024-06-09 DIAGNOSIS — R0602 Shortness of breath: Secondary | ICD-10-CM | POA: Insufficient documentation

## 2024-06-09 DIAGNOSIS — R072 Precordial pain: Secondary | ICD-10-CM | POA: Diagnosis present

## 2024-06-09 DIAGNOSIS — E119 Type 2 diabetes mellitus without complications: Secondary | ICD-10-CM | POA: Diagnosis not present

## 2024-06-09 LAB — CBC
HCT: 41.5 % (ref 39.0–52.0)
Hemoglobin: 13.1 g/dL (ref 13.0–17.0)
MCH: 25.6 pg — ABNORMAL LOW (ref 26.0–34.0)
MCHC: 31.6 g/dL (ref 30.0–36.0)
MCV: 81.2 fL (ref 80.0–100.0)
Platelets: 317 K/uL (ref 150–400)
RBC: 5.11 MIL/uL (ref 4.22–5.81)
RDW: 14.8 % (ref 11.5–15.5)
WBC: 10 K/uL (ref 4.0–10.5)
nRBC: 0 % (ref 0.0–0.2)

## 2024-06-09 LAB — BASIC METABOLIC PANEL WITH GFR
Anion gap: 12 (ref 5–15)
BUN: 7 mg/dL (ref 6–20)
CO2: 24 mmol/L (ref 22–32)
Calcium: 9 mg/dL (ref 8.9–10.3)
Chloride: 105 mmol/L (ref 98–111)
Creatinine, Ser: 0.75 mg/dL (ref 0.61–1.24)
GFR, Estimated: >60 mL/min
Glucose, Bld: 96 mg/dL (ref 70–99)
Potassium: 3.5 mmol/L (ref 3.5–5.1)
Sodium: 141 mmol/L (ref 135–145)

## 2024-06-09 LAB — D-DIMER, QUANTITATIVE: D-Dimer, Quant: 0.33 ug{FEU}/mL (ref 0.00–0.50)

## 2024-06-09 LAB — HEPATIC FUNCTION PANEL
ALT: 24 U/L (ref 0–44)
AST: 21 U/L (ref 15–41)
Albumin: 4.3 g/dL (ref 3.5–5.0)
Alkaline Phosphatase: 47 U/L (ref 38–126)
Bilirubin, Direct: 0.2 mg/dL (ref 0.0–0.2)
Indirect Bilirubin: 0.3 mg/dL (ref 0.3–0.9)
Total Bilirubin: 0.5 mg/dL (ref 0.0–1.2)
Total Protein: 7.7 g/dL (ref 6.5–8.1)

## 2024-06-09 LAB — TROPONIN T, HIGH SENSITIVITY: Troponin T High Sensitivity: <15 ng/L (ref 0–19)

## 2024-06-09 LAB — LIPASE, BLOOD: Lipase: 30 U/L (ref 11–51)

## 2024-06-09 MED ORDER — ACETAMINOPHEN 500 MG PO TABS
1000.0000 mg | ORAL_TABLET | Freq: Once | ORAL | Status: AC
  Administered 2024-06-09: 1000 mg via ORAL
  Filled 2024-06-09: qty 2

## 2024-06-09 MED ORDER — LIDOCAINE 5 % EX PTCH
1.0000 | MEDICATED_PATCH | CUTANEOUS | Status: DC
  Administered 2024-06-09: 1 via TRANSDERMAL
  Filled 2024-06-09: qty 1

## 2024-06-09 MED ORDER — ALUM & MAG HYDROXIDE-SIMETH 200-200-20 MG/5ML PO SUSP
30.0000 mL | Freq: Once | ORAL | Status: AC
  Administered 2024-06-09: 30 mL via ORAL
  Filled 2024-06-09: qty 30

## 2024-06-09 MED ORDER — LIDOCAINE 5 % EX PTCH: 1.0000 | MEDICATED_PATCH | CUTANEOUS | 0 refills | Status: AC

## 2024-06-09 NOTE — Discharge Instructions (Addendum)
 Take Tylenol  1 g every 8 hours for one week with the lidocaine  patches.  Return to the ER if you develop worsening symptoms, fevers or any other concerns.  Follow-up with your primary care doctor or consider calling GI to discuss a follow-up appointment given cardiology has already evaluated you however you may want to touch base with cardiology as well just to ensure there is nothing else that they would recommend at this point.

## 2024-06-09 NOTE — ED Notes (Signed)
 Pt given discharge paperwork and instructions. Pt verbalized understanding. Pt ambulated out of ED with a steady gait in no acute distress. Pt accompanied by family.

## 2024-06-09 NOTE — ED Triage Notes (Signed)
 Pt c/o intermittent chest pain x2 weeks. Pt has appointment with PCP on 1/22. Pt denies personal cardiac hx but says mother had cardiac stents. Pt also endorses SOB. Pt says he can feel his heart beating in his throat.

## 2024-06-09 NOTE — ED Provider Notes (Signed)
 "  [facility] Provider Note    Event Date/Time   First MD Initiated Contact with Patient 06/09/24 1728     (approximate)   History   Shortness of Breath and Chest Pain   HPI  [name] is a 49 y.o. male with type 2 diabetes who comes in with shortness of breath and chest pain.  Patient reports midsternal chest pain.  Does report feeling like he can feel his heartbeat in his throat as well as some shortness of breath.  He is been told previously that it is related to acid reflux and he is on Protonix  40 mg once daily but he states that this feels slightly different he denies any burning sensation.  He reports he has a primary care doctor appointment on the 22nd but he just felt that he needed to get seen before then to have it evaluated.  He does report some shortness of breath associated with it but denies any other risk factors for pulmonary embolism.  He denies any numbness, tingling in his arms or legs.  I reviewed a note from 4//2025 where patient underwent a cardiac perfusion test. IMPRESSION: No significant extracardiac findings.   LV perfusion is normal. There is no evidence of ischemia. There is no evidence of infarction.   Rest left ventricular function is normal. Rest EF: 61%. Stress left ventricular function is normal. Stress EF: 68%. End diastolic cavity size is mildly enlarged.   Myocardial blood flow was computed to be 1.38ml/g/min at rest and 2.6ml/g/min at stress. Global myocardial blood flow reserve was 2.89 and was normal.   Coronary calcium was absent on the attenuation correction CT images.  Aortic atherosclerosis was noted   The study is low risk.   Electronically signed by: Lonni Hanson, MD.    Physical Exam   Triage Vital Signs: ED Triage Vitals  Encounter Vitals Group     BP 06/09/24 1700 123/71     Girls Systolic BP Percentile --      Girls Diastolic BP Percentile --      Boys Systolic BP Percentile --      Boys  Diastolic BP Percentile --      Pulse Rate 06/09/24 1659 76     Resp 06/09/24 1659 20     Temp 06/09/24 1659 98.1 F (36.7 C)     Temp Source 06/09/24 1659 Oral     SpO2 06/09/24 1659 98 %     Weight 06/09/24 1655 (!) 355 lb (161 kg)     Height 06/09/24 1655 5' 7 (1.702 m)     Head Circumference --      Peak Flow --      Pain Score 06/09/24 1655 7     Pain Loc --      Pain Education --      Exclude from Growth Chart --     Most recent vital signs: Vitals:   06/09/24 1659 06/09/24 1700  BP:  123/71  Pulse: 76   Resp: 20   Temp: 98.1 F (36.7 C)   SpO2: 98%      General: Awake, no distress.  CV:  Good peripheral perfusion.  Resp:  Normal effort.  Clear lung Abd:  No distention.  Soft and nontender Other:  No swelling leg.  No calf tenderness.  Good distal pulses   ED Results / Procedures / Treatments   Labs (all labs ordered are listed, but only abnormal results are displayed) Labs Reviewed  CBC -  Abnormal; Notable for the following components:      Result Value   MCH 25.6 (*)    All other components within normal limits  BASIC METABOLIC PANEL WITH GFR  TROPONIN T, HIGH SENSITIVITY     EKG  My interpretation of EKG:  Normal sinus rate of 87 without any ST elevation or T wave inversions possible rightward axis deviation, normal intervals  RADIOLOGY I have reviewed the xray personally and interpreted no evidence of any pneumonia   PROCEDURES:  Critical Care performed: No  Procedures   MEDICATIONS ORDERED IN ED: Medications - No data to display   IMPRESSION / MDM / ASSESSMENT AND PLAN / ED COURSE  I reviewed the triage vital signs and the nursing notes.   Patient's presentation is most consistent with acute presentation with potential threat to life or bodily function.      INITIAL IMPRESSION / ASSESSMENT AND PLAN / ED COURSE   Most Likely DDx:  -Consider ACS vs MSK Vs Thalia- will get EKG/troponin to evaluate for ACS - Patient is EKG has  been read as atrial fibrillation but patient has clear sinus with and this is sinus in nature.   DDx that was also considered d/t potential to cause harm, but was found less likely based on history and physical (as detailed above): -PNA (no fevers, cough but CXR to evaluate) -PNX (reassured with equal b/l breath sounds, CXR to evaluate) -Symptomatic anemia (will get H&H) -Pulmonary embolism  not pleuritic in nature, no hypoxia, patient is PERC negative EKG however did show some right axis deviation although I reviewed some prior EKGs and these could have had that previously but I will get D-dimer just to screen for PE given he is low Wells score. -Aortic Dissection as no tearing pain and no radiation to the mid back, pulses equal, no murmur -Pericarditis no EKG changes or hx to suggest dx -Tamponade (no notable SOB, tachycardic, hypotensive) -Esophageal rupture (no h/o diffuse vomitting/no crepitus)  Troponin was negative CMP reassuring CBC reassuring.  D-dimer is negative Hepatic function, lipase were normal but patient really denied any upper abdominal pain so seems less likely related to gallstones.  Discussed with patient doing a second troponin.  Patient is low risk and he reports the chest pain has been constant for 1 week straight.  He denies the pain worsening today therefore patient declined second troponin which I think is reasonable given I suspect that it would be negative.  Patient been seen multiple times for chest pain with negative workup.  We discussed following up outpatient with cardiology as well as potentially discussing with GI.  He does report symptoms have resolved with medications above.  He is going to follow-up with his primary care doctor in a few days and go from there.    At this time given re-assuring workup I have considered CT imaging to rule out PE/Dissection but given history and physical exam these seem less likely and potential harm from CT would outweigh the  probability of finding PE or Dissection.    I have considered admission for patient, but given re-assuring workup including EKG, troponin, and re-assuring vitals patient can follow up outpatient with cardiology.  Discussed with patient that I can not predict future heart attacks and that cardiology can evaluate for need for further workup including stress test.  Explained to patient that if there is a change in symptoms, worsening symptoms, or any other concerns that they should return for repeat evaluation to have repeat  EKG/troponin. They expressed understanding.  ____________________________________________   Note:  This document was prepared using Dragon voice recognition software and may include unintentional dictation errors.   The patient is on the cardiac monitor to evaluate for evidence of arrhythmia and/or significant heart rate changes.      FINAL CLINICAL IMPRESSION(S) / ED DIAGNOSES   Final diagnoses:  Other chest pain     Rx / DC Orders   ED Discharge Orders          Ordered    lidocaine  (LIDODERM ) 5 %  Every 24 hours        06/09/24 1916             Note:  This document was prepared using Dragon voice recognition software and may include unintentional dictation errors.   Ernest Ronal BRAVO, MD 06/09/24 [id number]  "
